# Patient Record
Sex: Male | Born: 1965 | Race: White | Hispanic: No | Smoking: Current every day smoker
Health system: Southern US, Community
[De-identification: ages and names within clinical notes are randomized; demographics above are authoritative.]

## PROBLEM LIST (undated history)

## (undated) DIAGNOSIS — K219 Gastro-esophageal reflux disease without esophagitis: Secondary | ICD-10-CM

## (undated) DIAGNOSIS — K589 Irritable bowel syndrome without diarrhea: Secondary | ICD-10-CM

## (undated) DIAGNOSIS — Z681 Body mass index (BMI) 19 or less, adult: Secondary | ICD-10-CM

## (undated) HISTORY — DX: Gastro-esophageal reflux disease without esophagitis: K21.9

## (undated) HISTORY — PX: ESOPHAGOGASTRODUODENOSCOPY: SHX1529

## (undated) HISTORY — DX: Irritable bowel syndrome without diarrhea: K58.9

## (undated) HISTORY — DX: Body mass index (BMI) 19.9 or less, adult: Z68.1

---

## 2003-02-28 ENCOUNTER — Encounter: Payer: Self-pay | Admitting: *Deleted

## 2003-02-28 ENCOUNTER — Emergency Department (HOSPITAL_COMMUNITY): Admission: EM | Admit: 2003-02-28 | Discharge: 2003-02-28 | Payer: Self-pay | Admitting: *Deleted

## 2003-03-10 ENCOUNTER — Encounter (HOSPITAL_COMMUNITY): Admission: RE | Admit: 2003-03-10 | Discharge: 2003-04-09 | Payer: Self-pay | Admitting: Family Medicine

## 2003-03-13 ENCOUNTER — Ambulatory Visit (HOSPITAL_COMMUNITY): Admission: RE | Admit: 2003-03-13 | Discharge: 2003-03-13 | Payer: Self-pay | Admitting: Family Medicine

## 2003-03-13 ENCOUNTER — Encounter: Payer: Self-pay | Admitting: Family Medicine

## 2004-07-06 ENCOUNTER — Ambulatory Visit (HOSPITAL_COMMUNITY): Admission: RE | Admit: 2004-07-06 | Discharge: 2004-07-06 | Payer: Self-pay | Admitting: Family Medicine

## 2005-02-24 ENCOUNTER — Ambulatory Visit (HOSPITAL_COMMUNITY): Admission: RE | Admit: 2005-02-24 | Discharge: 2005-02-24 | Payer: Self-pay | Admitting: Family Medicine

## 2005-03-27 ENCOUNTER — Ambulatory Visit: Payer: Self-pay | Admitting: Infectious Diseases

## 2006-06-04 ENCOUNTER — Ambulatory Visit: Payer: Self-pay | Admitting: Gastroenterology

## 2006-06-06 ENCOUNTER — Encounter (INDEPENDENT_AMBULATORY_CARE_PROVIDER_SITE_OTHER): Payer: Self-pay | Admitting: Specialist

## 2006-06-06 ENCOUNTER — Ambulatory Visit: Payer: Self-pay | Admitting: Gastroenterology

## 2006-06-06 ENCOUNTER — Ambulatory Visit (HOSPITAL_COMMUNITY): Admission: RE | Admit: 2006-06-06 | Discharge: 2006-06-06 | Payer: Self-pay | Admitting: Gastroenterology

## 2006-07-13 ENCOUNTER — Ambulatory Visit: Payer: Self-pay | Admitting: Gastroenterology

## 2007-02-13 ENCOUNTER — Ambulatory Visit: Payer: Self-pay | Admitting: Gastroenterology

## 2007-04-12 ENCOUNTER — Ambulatory Visit: Payer: Self-pay | Admitting: Gastroenterology

## 2008-05-15 ENCOUNTER — Ambulatory Visit: Payer: Self-pay | Admitting: Gastroenterology

## 2008-11-30 ENCOUNTER — Encounter: Payer: Self-pay | Admitting: Gastroenterology

## 2009-05-05 ENCOUNTER — Ambulatory Visit (HOSPITAL_COMMUNITY): Admission: RE | Admit: 2009-05-05 | Discharge: 2009-05-05 | Payer: Self-pay | Admitting: Family Medicine

## 2009-06-24 ENCOUNTER — Encounter: Payer: Self-pay | Admitting: Urgent Care

## 2009-07-05 DIAGNOSIS — K589 Irritable bowel syndrome without diarrhea: Secondary | ICD-10-CM | POA: Insufficient documentation

## 2009-07-05 DIAGNOSIS — F17200 Nicotine dependence, unspecified, uncomplicated: Secondary | ICD-10-CM | POA: Insufficient documentation

## 2009-07-05 DIAGNOSIS — K219 Gastro-esophageal reflux disease without esophagitis: Secondary | ICD-10-CM | POA: Insufficient documentation

## 2009-07-07 ENCOUNTER — Ambulatory Visit: Payer: Self-pay | Admitting: Gastroenterology

## 2010-06-30 ENCOUNTER — Encounter (INDEPENDENT_AMBULATORY_CARE_PROVIDER_SITE_OTHER): Payer: Self-pay | Admitting: *Deleted

## 2010-07-18 ENCOUNTER — Encounter: Payer: Self-pay | Admitting: Urgent Care

## 2010-08-16 ENCOUNTER — Ambulatory Visit: Payer: Self-pay | Admitting: Gastroenterology

## 2010-10-11 NOTE — Medication Information (Signed)
Summary: DEXILANT 60MG   DEXILANT 60MG    Imported By: Rexene Alberts 07/18/2010 10:36:36  _____________________________________________________________________  External Attachment:    Type:   Image     Comment:   External Document  Appended Document: DEXILANT 60MG  Pt due for 1 yr OV.  Please arrange.   Prescriptions: DEXILANT 60 MG CPDR (DEXLANSOPRAZOLE) one by mouth daily for acid reflux  #30 x 0   Entered and Authorized by:   Joselyn Arrow FNP-BC   Signed by:   Joselyn Arrow FNP-BC on 07/18/2010   Method used:   Electronically to        [facility] [phone]* (retail)       [address]       [place], [place]  [address]       Ph: [phone] or [phone]       Fax: [phone]   RxID:   [id number]     Appended Document: DEXILANT 60MG  appt for 12/6 @ 2pm w/SF

## 2010-10-11 NOTE — Letter (Signed)
Summary: Recall Office Visit  [facility]  [address]   [place], [place] [address]   Phone: [phone]  Fax: [phone]      June 30, 2010   [name] [address] [facility] [address] [place], [place]  [address] 1966/04/23   Dear [name],   According to our records, it is time for you to schedule a follow-up office visit with [place].   At your convenience, please call [phone] to schedule an office visit. If you have any questions, concerns, or feel that this letter is in error, we would appreciate your call.   Sincerely,    Rosine Beat  [facility] Ph: [phone]   Fax: [phone]

## 2010-10-13 NOTE — Assessment & Plan Note (Signed)
Summary: IBS, GERD   Visit Type:  Follow-up Visit Primary Care Ameerah Huffstetler:  Phillips Odor, M.D.  Chief Complaint:  IBS and GERD.  History of Present Illness: Meds working. Nees refills. Bms: at least 1-2 times a day, nl except for 2 days out of a month. Pain: none. Heartburn today because out of meds but not bad. If misses meds Sx worsen. Weight:  ~125 lbs.  Allergies: 1)  ! Codeine  Past History:  Past Medical History: GERD Irritable Bowel Syndrome **EGD/BX 2007-NL SB, MILD GASTRITIS  Family History: No FH of Colon Cancer OR POLYPS  Social History: MARRIED SMOKES NO ETOH  Review of Systems       2007: 120 LBS  2008: 119 LBS  Vital Signs:  Patient profile:   45 year old male Height:      68 inches Weight:      118 pounds BMI:     18.01 Temp:     98.4 degrees F oral Pulse rate:   84 / minute BP sitting:   100 / 70  (left arm) Cuff size:   regular  Vitals Entered By: Cloria Spring LPN (August 16, 2010 2:18 PM)  Physical Exam  General:  Well developed, well nourished, no acute distress. Head:  Normocephalic and atraumatic. Lungs:  Clear throughout to auscultation. Heart:  Regular rate and rhythm; no murmurs. Abdomen:  Soft, nontender and nondistended. Normal bowel sounds.  Impression & Recommendations:  Problem # 1:  IRRITABLE BOWEL SYNDROME (ICD-564.1) Assessment Improved Dicyclomine as needed. OPV in 12 mos.  Problem # 2:  GERD (ICD-530.81) Assessment: Improved Continue Dexilant, RFx12.  CC: PCP Prescriptions: DEXILANT 60 MG CPDR (DEXLANSOPRAZOLE) one by mouth daily for acid reflux  #30 x 11   Entered and Authorized by:   West Bali MD   Signed by:   West Bali MD on 08/16/2010   Method used:   [facility]        [facility]  [address] [id number]       [address]       [place], [place]  [address]       Ph: [phone] or [phone]       Fax: [phone]   RxID:   [id number] DICYCLOMINE HCL 10 MG CAPS  (DICYCLOMINE HCL) one by mouth qac and at bedtime prn  #60 x 11   Entered and Authorized by:   West Bali MD   Signed by:   West Bali MD on 08/16/2010   Method used:   [facility]        [facility]  [address] [id number]       [address]       [place], [place]  [address]       Ph: [phone] or [phone]       Fax: [phone]   RxID:   [id number]   Appended Document: IBS, GERD 3YR F/U OPV IS IN THE COMPUTER  Appended Document: Orders Update    Clinical Lists Changes  Orders: Added new Service order of Est. Patient Level II (57322) - Signed

## 2011-01-24 NOTE — Assessment & Plan Note (Signed)
NAMEMarland Kitchen  [name], [name]              CHART#:  [id number]   DATE:  04/12/2007                       DOB:  11/09/1965   REFERRING PHYSICIAN:  Corrie Mckusick, M.D.   PROBLEM LIST:  1. Irritable bowel syndrome.  2. Gastroesophageal reflux disease.   SUBJECTIVE:  The patient is a 45 year old male who presents as a return  patient visit.  His symptoms are improved since his visit in June 2008.  He is managing his symptoms with Prevacid, Fibercon, and dicyclomine.  The burning in his abdomen is not nearly as bad as it was.  He is taking  Prevacid once daily.  He still continues to smoke.  His bowel movements  are good.  He has normal bowel movements and occasionally his stomach  acts up.   MEDICATIONS:  1. Dicyclomine twice daily.  2. Imodium as needed.  3. Fibercon 2 daily.  4. Prevacid 30 mg daily.  5. Tylenol 2 daily as needed.  6. Xanax 5 mg 1/2 tablet as needed.   OBJECTIVE:  Weight 120 pounds (unchanged since November 2007), height 5  feet 8 inches, temperature 97.9, blood pressure 110/60, pulse 78.  GENERAL:  He is in no apparent distress.  He is alert and oriented x4.  LUNGS:  Clear to auscultation bilaterally.  CARDIOVASCULAR:  Regular rhythm.  ABDOMEN:  Bowel sounds present.  Soft, nontender.  Nondistended.   ASSESSMENT:  The patient is a 45 year old male with irritable bowel and  gastroesophageal reflux disease, which is currently well-controlled.  Thank you for allowing me to see this patient in consultation.  My  recommendations are to follow.   RECOMMENDATIONS:  1. He should continue the Prevacid, Fibercon, and dicyclomine.  He is      given a prescription for Prevacid for a 90-day supply with 3      refills.  He is also given a Prevacid co-pay      reimbursement card.  2. Outpatient visit in 4 months.       Kassie Mends, M.D.  Electronically Signed     SM/MEDQ  D:  04/12/2007  T:  04/13/2007  Job:  [id number]   cc:   Corrie Mckusick, M.D.

## 2011-01-24 NOTE — Assessment & Plan Note (Signed)
NAMEMarland Kitchen  [name], [name]              CHART#:  [id number]   DATE:  02/13/2007                       DOB:  [birth year]   REFERRING PHYSICIAN:  Dr. Assunta Found.   PROBLEM LIST:  1. Irritable bowel syndrome.  2. Functional gut disorder (irritable bowel syndrome perhaps      gastroesophageal reflux disease).   SUBJECTIVE:  [name] is a 45 year old male who was last seen in  clinic in November 2007. He was asked to use dicyclomine, as well as  fiber supplements, and Tylenol. His Prilosec was discontinued at the  time. He states that he was doing great until approximately six weeks  ago when he began to have belching and food laying on his stomach. He  has this burning sensation in his mid abdomen and he began to use his  dicyclomine 4 times daily. He stated that using dicyclomine 4 times a  day made his burning sensation worse, so now he is down to twice-a-day.  He is using his dicyclomine after he eats, and not before. He has this  burning sensation 1-2 times a week. [name] makes his burning sensation  worse. He has up to 2 bowel movements a day which are mostly normal.  When he is having a flare, his stool is like phlegm. He denies any  weight loss or problems swallowing. He is having problems with sinus  drainage and is using an over-the-counter sinus pill. He denies any  vomiting. He has problems with indigestion and it feels like he is  spitting up acid once a week. He does smoke one pack a day. He denies  any alcohol use for 10 years. He is not using any BC, Goody's, or  aspirin. He is only using Tylenol.   MEDICATIONS:  1. Dicyclomine 2-3 times daily.  2. Imodium as needed.  3. Fibercon 2 daily.  4. Tylenol as needed.   OBJECTIVE:  VITAL SIGNS:  Weight 119 1/2 pounds (unchanged since  November 2007), height 5 foot 8 inches, temperature 98.1, blood pressure  190/60, pulse 76.  GENERAL:  No apparent distress, awake, alert, and oriented x4.HEENT:  Atraumatic normocephalic,  pupils equal, round, and reactive to light,  mouth shows no oral lesions, posterior pharynx without erythema or  exudate. NECK:  Full range of motion, no lymphadenopathy.LUNGS:  Clear  to auscultation bilaterally.CARDIAC:  Regular rhythm, no murmur, normal  S1 and S2.  ABDOMEN:  Bowel sounds are present. Soft and nontender, nondistended, no  rebound or guarding.   ASSESSMENT:  [name] is a 45 year old male with irritable bowel  syndrome. His current symptoms are either an exacerbation of his  irritable bowel syndrome, non-ulcer dyspepsia, or gastric esophageal  reflux disease, which is uncontrolled. Thank you for allowing me to see  [name] in consultation. My recommendations follow.   RECOMMENDATIONS:  1. He is asked to use his dicyclomine 30 minutes before meals, not      after.  2. He is given Prevacid 30 mg tablets, number 12 samples, and asked to      use them for the next 12 days. If the burning sensation and the      acid taste in the back of his throat, then he is given a      prescription and asked to use 1 daily. He is also given the  [facility] GERD handout and is asked to follow those recommendations. I      did discuss with him that smoking may exacerbate his symptoms.  3. I did recommend to him that he should decrease the amount of      cigarettes he smokes on a daily basis.  4. He should continue his high fiber diet.  5. He has a return patient visit to see me in six weeks.       Kassie Mends, M.D.  Electronically Signed     SM/MEDQ  D:  02/13/2007  T:  02/14/2007  Job:  [id number]   cc:   Corrie Mckusick, M.D.

## 2011-01-24 NOTE — Assessment & Plan Note (Signed)
NAMEMarland Kitchen  [name], [name]              CHART#:  [id number]   DATE:  05/15/2008                       DOB:  10/07/1965   PRIMARY CARE PHYSICIAN:  Dr. Phillips [name].   CHIEF COMPLAINT:  Followup IBS and GERD.   PROBLEM LIST:  1. Irritable bowel syndrome.  2. Gastroesophageal reflux disease.   SUBJECTIVE:  The patient is a 45 year old Caucasian male who has been  doing very well.  He usually has about 1 week out of each month where he  presents with diarrhea stool.  He has been taking Imodium like once or  twice a week and doing very well.  He has been on Prevacid 30 mg daily.  He has tried to discontinue this on his own, but does have heartburn and  ingestion when he stops the medication.  He is asking for a medicine  that may be cheaper than his 50 dollar per month co-pay.  He has  previously tried over-the-counter Prilosec 20 mg daily, but denies any  other PPI use.  He denies any anorexia.  His weight has remained stable.  He does use dicyclomine 10 mg daily as needed for diarrhea.  He is not  using fiber recently as he feels it does not have much effect on his  IBS.   CURRENT MEDICATIONS:  Dicyclomine 10 mg daily, Imodium 2 mg p.r.n.,  Tylenol Extra Strength 1 to 2 daily p.r.n., Prevacid 30 mg daily, and  Xanax 2.5 mg p.r.n.   ALLERGIES:  Codeine.   OBJECTIVE:  VITAL SIGNS:  Weight 155 pounds, height 68 inches,  temperature 98.1, blood pressure 102/78, and pulse 82.  GENERAL:  The patient is a well-developed and well-nourished Caucasian  male, in no acute distress.  HEENT:  Sclerae clear and nonicteric.  Conjunctivae pink.  Oropharynx  pink and moist without any lesions.  NECK:  Supple without mass or thyromegaly.  CHEST:  Heart regular rate and rhythm.  Normal S1 and S2.  ABDOMEN:  Positive bowel sounds x4.  No bruits auscultated.  Soft,  nontender, and nondistended without palpable mass or hepatosplenomegaly.  No rebound, tenderness, or guarding.  EXTREMITIES:  Without clubbing  or edema.   ASSESSMENT:  The patient is a 45 year old Caucasian male with history of  chronic irritable bowel syndrome well controlled on antispasmodic and  p.r.n. Imodium.   He also has chronic gastroesophageal reflux disease, well controlled on  Prevacid, but is concerned about the $50 co-pay.   PLAN:  1. We would discontinue Prevacid.  2. We had given him a box of Kapidex 60 mg daily samples.  He has also      been given a rebate  card for a trial.  3. Continue dicyclomine 10 mg daily p.r.n.  4. Office visit in 1 year or sooner if needed.       [name], N.P.  Electronically Signed     [name], M.D.  Electronically Signed    KJ/MEDQ  D:  05/15/2008  T:  05/15/2008  Job:  [id number]   cc:   [name], M.D.

## 2011-01-27 NOTE — Consult Note (Signed)
[name], [name]             ACCOUNT NO.:  [id number]   MEDICAL RECORD NO.:  [id number]          PATIENT TYPE:  AMB   LOCATION:  DAY                           FACILITY:  [facility]   PHYSICIAN:  Kassie Mends, M.D.      DATE OF BIRTH:  [birth year]   DATE OF CONSULTATION:  06/04/2006  DATE OF DISCHARGE:                                   CONSULTATION   REFERRING PHYSICIAN:  Dr. Assunta Found   REASON FOR CONSULTATION:  Abdominal pain, diarrhea, now with constipation.   HISTORY OF PRESENT ILLNESS:  [name] is a 45 year old male who had  abdominal complaints for 8 years.  He complains of his stomach stays tore  up.  His last incident has been occurring for the last 4-5 weeks until  Friday.  His flares usually last 7-10 days.  He reports getting diarrhea and  then he gets back to normal.  During his episodes of watery stool he usually  takes Imodium.  He now complains of being locked up.  He cannot go at all.  He complains of keeping indigestion constantly.  He described his  indigestion as belching and reflux of contents into his esophagus and  returning on itself.  He reports having ultrasounds, colonoscopies, and  upper endoscopy and everything has been normal.  He reports having stool  samples and everything has been normal.  He states if he takes half of a 0.5-  mg Xanax everything settles down.  His diarrhea is helped with Imodium.  If he stands up in the morning he just belches.  He may have gas all day.  He wants relief from the acid in his stomach, the diarrhea, and the burning  sensation in the stomach.  His weight fluctuates between 121-125 pounds to  114 pounds.  He weighs 117 pounds today.  He has been known to have a small  hemorrhoid.  On Sunday he had straining and saw some bright red blood.  He  saw some blood on the tissue when he wiped.  He denies any blood in the  stool.  His last bowel movement was approximately 2 hours ago and it was  loose to watery.  He was having  to strain to have a bowel movement.  His  appetite has been okay.  He had been on dicyclomine for times a day for the  last 3 weeks.  It has decreased his belching.  He continues to have to go to  the bathroom 30 minutes after he eats.  Having a bowel movement keeps him  from having cramping.  Normally he has one bowel movement a day.   PAST MEDICAL HISTORY:  Sinus.   PAST SURGICAL HISTORY:  Removal of cyst on his abdomen 10 years ago.   ALLERGIES:  CODEINE.   MEDICATIONS:  1. Dicyclomine q.i.d.  2. Imodium p.r.n.  3. Aleve p.r.n.   FAMILY HISTORY:  He denies any family history of colon cancer, colon polyps,  inflammatory bowel disease, or celiac sprue.   SOCIAL HISTORY:  He is married and has three children.  He has one natural  child.  He is employed with [employer].  He smokes one pack a day.  He stopped drinking 10 years ago.   REVIEW OF SYSTEMS:  Reveals a usual body mass index of 19, which makes him  underweight.  His review of systems per the HPI, otherwise all systems are  negative.   PHYSICAL EXAMINATION:  VITAL SIGNS:  Weight 117.5 pounds, height 5 feet 8  inches, BMI 17.8 (underweight), temperature 98.3, blood pressure 106/68,  pulse 76.  GENERAL:  He is in no apparent distress, alert and oriented x4.  HEENT:  Exam is atraumatic, normocephalic.  Pupils equal and reactive to  light.  Mouth:  No oral lesions.  Posterior pharynx without erythema or  exudate.  NECK:  Has full range of motion.  No lymphadenopathy.  LUNGS:  Clear to auscultation bilaterally.  CARDIOVASCULAR:  A regular rhythm, no murmur, normal S1 and S2.  ABDOMEN:  Bowel sounds are present, soft, nontender, nondistended.  No  rebound or guarding.  No hepatosplenomegaly.  No abdominal bruits.  EXTREMITIES:  Without cyanosis, clubbing or edema.  NEUROLOGIC:  He has no focal neurologic deficits.  RECTAL:  Heme-positive, trace; small internal hemorrhoids, grade 1.  No  mass, normal perineal area,  no anal fissure.   Labs from May 10, 2006:  White count 7.4, hemoglobin 16.8, platelets 308.  Sed rate 2.  Potassium 4.1, creatinine 1, total bili 1.7, alk phos 55, AST  15, ALT 12, albumin 5.0, calcium 9.6.  TSH 1.018.  Helicobacter pylori 0.5.  C. difficile negative.  Fecal wbc's:  None.   ASSESSMENT:  [name] is a 45 year old male with bright red blood per  rectum, likely secondary to hemorrhoids.  His intermittent diarrhea is  likely secondary to irritable bowel syndrome, diarrhea predominant.  The  differential diagnosis includes celiac sprue and giardiasis.  His symptoms  have now resolved on dicyclomine.   Thank you for allowing me to see [name] in consultation.  My  recommendations follow.   RECOMMENDATIONS:  Will obtain an EGD to evaluate his persistent abdominal  pain, to evaluate for a gastritis, and obtain biopsies for eosinophilic  gastritis or H. pylori gastritis and duodenal biopsies for celiac sprue.  If  his diarrhea should return, I would consider obtaining stool for Giardia  antigen.  He had formed stool in his rectal vault and obtaining Giardia  antigen at this time is of low clinical value.  I would change his  dicyclomine to twice a day for 1 week and then once a day for 1 week and  then as needed.  He is asked to initiate a high-fiber diet and a handout is  given and explained.  He may return to see me in 1 month.   Please feel free to contact me at [phone] with additional questions.      Kassie Mends, M.D.  Electronically Signed     SM/MEDQ  D:  06/05/2006  T:  06/06/2006  Job:  [id number]   cc:   Corrie Mckusick, M.D.  Fax: [phone]

## 2011-01-27 NOTE — Op Note (Signed)
NAMEXUE, LOW             ACCOUNT NO.:  [id number]   MEDICAL RECORD NO.:  [id number]          PATIENT TYPE:  AMB   LOCATION:  DAY                           FACILITY:  [facility]   PHYSICIAN:  Kassie Mends, M.D.      DATE OF BIRTH:  [birth year]   DATE OF PROCEDURE:  06/06/2006  DATE OF DISCHARGE:  06/06/2006                                  PROCEDURE NOTE   REFERRING PHYSICIAN:  Assunta Found, MD   PROCEDURE:  Esophagogastroduodenoscopy with cold forceps biopsies.   ENDOSCOPIST:  Kassie Mends, MD   INDICATION FOR EXAM:  Mr. Kampe is a 45 year old male with chronic burning  epigastric pain and episodic diarrhea.   FINDINGS:  1. Irregular Z line.  2. Small hiatal hernia.  3. Patent Schatzki's ring.  4  Moderate antritis without erosion.  Biopsies obtained with cold forceps  to evaluate for H. pylori infection.  1. Mild erythema and edema of the duodenal bulb.  Biopsies obtained via      cold forceps.  2. Normal-appearing duodenal mucosa in the second portion of the duodenum.      Biopsies obtained to evaluate for celiac sprue.   RECOMMENDATIONS:  1..  Follow up biopsies.  1. Over-the-counter Prilosec daily.  2. Diet handout given to instruct on avoiding gastric irritants.  3. Follow up with Dr. Kassie Mends in 3-4 weeks.   MEDICATIONS:  1. Demerol 75 mg IV.  2. Versed 6 mg IV.  3. Phenergan 12.5 mg IV.   PROCEDURE TECHNIQUE:  Physical exam was performed and informed consent was  obtained from the patient after explaining the benefits, risks and  alternatives to the procedure, which the patient appeared to understand and  so stated.  The patient was connected to the monitor and placed in the left  lateral position.  Continuous oxygen was provided by nasal and IV medicine  administered through an indwelling cannula.  After administration of  sedation, the patient's esophagus was intubated and the scope was advanced  under direct visualization to the second portion of  the  duodenum.  The scope was slowly removed by carefully examining the  anatomy, integrity and texture of the mucosa on the way out.  The patient  was recovered in the endoscopy suite and discharged to home in satisfactory  condition.      Kassie Mends, M.D.  Electronically Signed     SM/MEDQ  D:  06/06/2006  T:  06/08/2006  Job:  [id number]   cc:   Corrie Mckusick, M.D.  Fax: [phone]

## 2011-06-20 ENCOUNTER — Encounter: Payer: Self-pay | Admitting: Gastroenterology

## 2011-10-24 ENCOUNTER — Encounter: Payer: Self-pay | Admitting: Gastroenterology

## 2011-10-25 ENCOUNTER — Encounter: Payer: Self-pay | Admitting: Gastroenterology

## 2011-10-25 ENCOUNTER — Ambulatory Visit (INDEPENDENT_AMBULATORY_CARE_PROVIDER_SITE_OTHER): Payer: BC Managed Care – PPO | Admitting: Gastroenterology

## 2011-10-25 DIAGNOSIS — K219 Gastro-esophageal reflux disease without esophagitis: Secondary | ICD-10-CM

## 2011-10-25 DIAGNOSIS — K589 Irritable bowel syndrome without diarrhea: Secondary | ICD-10-CM

## 2011-10-25 MED ORDER — DICYCLOMINE HCL 10 MG PO CAPS: ORAL_CAPSULE | ORAL | Status: DC

## 2011-10-25 MED ORDER — DEXLANSOPRAZOLE 60 MG PO CPDR: 60.0000 mg | DELAYED_RELEASE_CAPSULE | Freq: Every day | ORAL | Status: DC

## 2011-10-25 NOTE — Patient Instructions (Signed)
Continue DEXILANT. USE BENTYL 1 TO 2 TIMES A DAY. FOLLOW UP EVERY 1 TO 2 YEARS.   Lifestyle and home remedies You may eliminate or reduce the frequency of heartburn by making the following lifestyle changes:    Control your weight. Being overweight is a major risk factor for heartburn and GERD. Excess pounds put pressure on your abdomen, pushing up your stomach and causing acid to back up into your esophagus.     Eat smaller meals. 4 TO 6 MEALS A DAY. This reduces pressure on the lower esophageal sphincter, helping to prevent the valve from opening and acid from washing back into your esophagus.     Loosen your belt. Clothes that fit tightly around your waist put pressure on your abdomen and the lower esophageal sphincter.    Eliminate heartburn triggers. Everyone has specific triggers.     Common triggers such as fatty or fried foods, spicy food, tomato sauce, carbonated beverages, alcohol, chocolate, mint, garlic, onion, caffeine and nicotine may make heartburn worse.     Avoid stooping or bending. Tying your shoes is OK. Bending over for longer periods to weed your garden isn't, especially soon after eating.     Don't lie down after a meal. Wait at least three to four hours after eating before going to bed, and don't lie down right after eating.   Alternative medicine   Several home remedies exist for treating GERD, but they provide only temporary relief. They include drinking baking soda (sodium bicarbonate) added to water or drinking other fluids such as baking soda mixed with cream of tartar and water.   Although these liquids create temporary relief by neutralizing, washing away or buffering acids, eventually they aggravate the situation by adding gas and fluid to your stomach, increasing pressure and causing more acid reflux. Further, adding more sodium to your diet may increase your blood pressure and add stress to your heart, and excessive bicarbonate ingestion can alter the  acid-base balance in your body.

## 2011-10-25 NOTE — Assessment & Plan Note (Signed)
SX CONTROLLED.  DICYCLOMINE 1-2 TIMES DAILY.

## 2011-10-25 NOTE — Assessment & Plan Note (Signed)
FAILED PREVACID & PRILOSEC IN THE PAST. SX CONTROLLED ON DEXILANT SINCE SEP 2009.  CONTINUE DEXILANT. REFILLED x1 YEAR. OPV Q1-2 YEARS. REFILLS OVER THE PHONE.

## 2011-10-25 NOTE — Progress Notes (Signed)
Faxed to PCP

## 2011-10-25 NOTE — Progress Notes (Signed)
  Subjective:    Patient ID: [name], male    DOB: 1966/01/11, 46 y.o.   MRN: [id number]  PCP: Phillips Odor  HPI PT DOING GOOD. Has tried PPI in past and Sx not controlled. Dexilant controls Sx 90% of the time. Depends on what he eats. Has flares 1 to 2 times a month. Bms: nl.using dicyclomine: 1 x/day. No problems swallowing, nausea, or vomiting.   Past Medical History  Diagnosis Date  . GERD (gastroesophageal reflux disease)   . IBS (irritable bowel syndrome)       . Body mass index (BMI) of 19 or less in adult 2007 117 LBS   Past Surgical History  Procedure Date  . Esophagogastroduodenoscopy SEP 2007 EPIG PAIN, DIARRHEA    MILD GASTRITIS/DUODENITIS, NO CELIAC SPRUE, HH,  SCHATZKI'S RING   Allergies  Allergen Reactions  . Codeine     REACTION: unknown reaction   Current Outpatient Prescriptions  Medication Sig Dispense Refill  . DEXILANT 60 MG capsule Take 60 mg by mouth daily.       Family History  Problem Relation Age of Onset  . Colon cancer Neg Hx   . Colon polyps Neg Hx     Review of Systems     Objective:   Physical Exam  Constitutional: He is oriented to person, place, and time. No distress.  HENT:  Head: Normocephalic and atraumatic.  Cardiovascular: Normal rate and regular rhythm.   Pulmonary/Chest: Effort normal and breath sounds normal. No respiratory distress.  Abdominal: Soft. Bowel sounds are normal. He exhibits no distension. There is no tenderness.  Neurological: He is alert and oriented to person, place, and time.       NO FOCAL DEFICITS           Assessment & Plan:

## 2011-10-30 NOTE — Progress Notes (Signed)
Reminder in epic to follow up every one to two years

## 2011-11-09 ENCOUNTER — Telehealth: Payer: Self-pay

## 2011-11-09 MED ORDER — PANTOPRAZOLE SODIUM 40 MG PO TBEC: 40.0000 mg | DELAYED_RELEASE_TABLET | Freq: Every day | ORAL | Status: DC

## 2011-11-09 NOTE — Telephone Encounter (Signed)
Working on [employer] for pts dexilant. Pt has tried prilosec, prevacid, nexium, protonix, rolaids and tums in the past. Insurance requirements states pt needs to try them within the last 180 days. Ginger spoke with pt, he has tried protonix in the past but not recently. He is willing to try it again and will call if it doesn't work so we can try PA again.  [facility]

## 2011-11-10 ENCOUNTER — Other Ambulatory Visit: Payer: Self-pay | Admitting: Urgent Care

## 2011-11-10 MED ORDER — PANTOPRAZOLE SODIUM 40 MG PO TBEC: 40.0000 mg | DELAYED_RELEASE_TABLET | Freq: Every day | ORAL | Status: DC

## 2012-02-04 ENCOUNTER — Other Ambulatory Visit: Payer: Self-pay | Admitting: Gastroenterology

## 2014-06-15 ENCOUNTER — Encounter (INDEPENDENT_AMBULATORY_CARE_PROVIDER_SITE_OTHER): Payer: Self-pay

## 2014-06-15 ENCOUNTER — Ambulatory Visit (HOSPITAL_COMMUNITY)
Admission: RE | Admit: 2014-06-15 | Discharge: 2014-06-15 | Disposition: A | Discharge status: Home / Self Care | Payer: BC Managed Care – PPO | Source: Ambulatory Visit | Attending: Family Medicine | Admitting: Family Medicine

## 2014-06-15 ENCOUNTER — Other Ambulatory Visit (HOSPITAL_COMMUNITY): Payer: Self-pay | Admitting: Family Medicine

## 2014-06-15 DIAGNOSIS — R05 Cough: Secondary | ICD-10-CM | POA: Insufficient documentation

## 2014-06-15 DIAGNOSIS — R0789 Other chest pain: Secondary | ICD-10-CM

## 2014-06-15 DIAGNOSIS — Z0000 Encounter for general adult medical examination without abnormal findings: Secondary | ICD-10-CM

## 2014-06-15 DIAGNOSIS — Z720 Tobacco use: Secondary | ICD-10-CM | POA: Insufficient documentation

## 2014-06-15 DIAGNOSIS — R509 Fever, unspecified: Secondary | ICD-10-CM | POA: Diagnosis present

## 2014-06-15 DIAGNOSIS — R0989 Other specified symptoms and signs involving the circulatory and respiratory systems: Secondary | ICD-10-CM | POA: Diagnosis not present

## 2014-06-16 ENCOUNTER — Other Ambulatory Visit (HOSPITAL_COMMUNITY): Payer: Self-pay | Admitting: Family Medicine

## 2014-06-16 DIAGNOSIS — R5383 Other fatigue: Secondary | ICD-10-CM

## 2014-06-16 DIAGNOSIS — R634 Abnormal weight loss: Secondary | ICD-10-CM

## 2014-06-16 DIAGNOSIS — R0789 Other chest pain: Secondary | ICD-10-CM

## 2014-06-18 ENCOUNTER — Ambulatory Visit (HOSPITAL_COMMUNITY): Payer: BC Managed Care – PPO

## 2014-06-18 ENCOUNTER — Ambulatory Visit (HOSPITAL_COMMUNITY)
Admission: RE | Admit: 2014-06-18 | Discharge: 2014-06-18 | Disposition: A | Discharge status: Home / Self Care | Payer: BC Managed Care – PPO | Source: Ambulatory Visit | Attending: Family Medicine | Admitting: Family Medicine

## 2014-06-18 ENCOUNTER — Encounter (HOSPITAL_COMMUNITY): Payer: Self-pay

## 2014-06-18 DIAGNOSIS — J439 Emphysema, unspecified: Secondary | ICD-10-CM | POA: Diagnosis not present

## 2014-06-18 DIAGNOSIS — R911 Solitary pulmonary nodule: Secondary | ICD-10-CM | POA: Insufficient documentation

## 2014-06-18 DIAGNOSIS — R079 Chest pain, unspecified: Secondary | ICD-10-CM | POA: Insufficient documentation

## 2014-06-18 DIAGNOSIS — R5383 Other fatigue: Secondary | ICD-10-CM | POA: Diagnosis not present

## 2014-06-18 DIAGNOSIS — R634 Abnormal weight loss: Secondary | ICD-10-CM | POA: Insufficient documentation

## 2014-06-18 DIAGNOSIS — R0789 Other chest pain: Secondary | ICD-10-CM

## 2014-06-18 MED ORDER — IOHEXOL 300 MG/ML  SOLN
100.0000 mL | Freq: Once | INTRAMUSCULAR | Status: AC | PRN
  Administered 2014-06-18: 100 mL via INTRAVENOUS

## 2014-06-19 ENCOUNTER — Other Ambulatory Visit (HOSPITAL_COMMUNITY): Payer: Self-pay | Admitting: Family Medicine

## 2014-06-19 DIAGNOSIS — N5089 Other specified disorders of the male genital organs: Secondary | ICD-10-CM

## 2014-06-26 ENCOUNTER — Other Ambulatory Visit (HOSPITAL_COMMUNITY): Payer: BC Managed Care – PPO

## 2015-03-09 ENCOUNTER — Other Ambulatory Visit (HOSPITAL_COMMUNITY): Payer: Self-pay

## 2015-03-09 ENCOUNTER — Other Ambulatory Visit (HOSPITAL_COMMUNITY): Payer: Self-pay | Admitting: Family Medicine

## 2015-03-09 DIAGNOSIS — M5412 Radiculopathy, cervical region: Secondary | ICD-10-CM

## 2015-03-09 DIAGNOSIS — M25512 Pain in left shoulder: Secondary | ICD-10-CM

## 2015-03-09 DIAGNOSIS — M542 Cervicalgia: Secondary | ICD-10-CM

## 2016-12-13 DIAGNOSIS — Z681 Body mass index (BMI) 19 or less, adult: Secondary | ICD-10-CM | POA: Diagnosis not present

## 2016-12-13 DIAGNOSIS — Z23 Encounter for immunization: Secondary | ICD-10-CM | POA: Diagnosis not present

## 2016-12-13 DIAGNOSIS — S80861A Insect bite (nonvenomous), right lower leg, initial encounter: Secondary | ICD-10-CM | POA: Diagnosis not present

## 2016-12-13 DIAGNOSIS — Z1389 Encounter for screening for other disorder: Secondary | ICD-10-CM | POA: Diagnosis not present

## 2017-04-27 DIAGNOSIS — R109 Unspecified abdominal pain: Secondary | ICD-10-CM | POA: Diagnosis not present

## 2017-04-27 DIAGNOSIS — R112 Nausea with vomiting, unspecified: Secondary | ICD-10-CM | POA: Diagnosis not present

## 2017-04-27 DIAGNOSIS — N200 Calculus of kidney: Secondary | ICD-10-CM | POA: Diagnosis not present

## 2017-04-27 DIAGNOSIS — R1032 Left lower quadrant pain: Secondary | ICD-10-CM | POA: Diagnosis not present

## 2017-07-23 DIAGNOSIS — S2231XA Fracture of one rib, right side, initial encounter for closed fracture: Secondary | ICD-10-CM | POA: Diagnosis not present

## 2017-09-18 DIAGNOSIS — R5383 Other fatigue: Secondary | ICD-10-CM | POA: Diagnosis not present

## 2017-09-18 DIAGNOSIS — E559 Vitamin D deficiency, unspecified: Secondary | ICD-10-CM | POA: Diagnosis not present

## 2017-09-18 DIAGNOSIS — K589 Irritable bowel syndrome without diarrhea: Secondary | ICD-10-CM | POA: Diagnosis not present

## 2017-09-18 DIAGNOSIS — Z681 Body mass index (BMI) 19 or less, adult: Secondary | ICD-10-CM | POA: Diagnosis not present

## 2017-09-18 DIAGNOSIS — F419 Anxiety disorder, unspecified: Secondary | ICD-10-CM | POA: Diagnosis not present

## 2017-09-18 DIAGNOSIS — Z7189 Other specified counseling: Secondary | ICD-10-CM | POA: Diagnosis not present

## 2017-09-18 DIAGNOSIS — Z1389 Encounter for screening for other disorder: Secondary | ICD-10-CM | POA: Diagnosis not present

## 2017-11-12 DIAGNOSIS — R062 Wheezing: Secondary | ICD-10-CM | POA: Diagnosis not present

## 2017-11-12 DIAGNOSIS — R05 Cough: Secondary | ICD-10-CM | POA: Diagnosis not present

## 2017-11-12 DIAGNOSIS — Z1389 Encounter for screening for other disorder: Secondary | ICD-10-CM | POA: Diagnosis not present

## 2017-11-12 DIAGNOSIS — J3489 Other specified disorders of nose and nasal sinuses: Secondary | ICD-10-CM | POA: Diagnosis not present

## 2017-11-12 DIAGNOSIS — J019 Acute sinusitis, unspecified: Secondary | ICD-10-CM | POA: Diagnosis not present

## 2017-11-12 DIAGNOSIS — Z681 Body mass index (BMI) 19 or less, adult: Secondary | ICD-10-CM | POA: Diagnosis not present

## 2017-12-06 DIAGNOSIS — T1511XA Foreign body in conjunctival sac, right eye, initial encounter: Secondary | ICD-10-CM | POA: Diagnosis not present

## 2018-01-10 DIAGNOSIS — Z1389 Encounter for screening for other disorder: Secondary | ICD-10-CM | POA: Diagnosis not present

## 2018-01-10 DIAGNOSIS — T148XXA Other injury of unspecified body region, initial encounter: Secondary | ICD-10-CM | POA: Diagnosis not present

## 2018-01-10 DIAGNOSIS — Z681 Body mass index (BMI) 19 or less, adult: Secondary | ICD-10-CM | POA: Diagnosis not present

## 2018-01-10 DIAGNOSIS — Z719 Counseling, unspecified: Secondary | ICD-10-CM | POA: Diagnosis not present

## 2018-05-23 DIAGNOSIS — Z1389 Encounter for screening for other disorder: Secondary | ICD-10-CM | POA: Diagnosis not present

## 2018-05-23 DIAGNOSIS — J069 Acute upper respiratory infection, unspecified: Secondary | ICD-10-CM | POA: Diagnosis not present

## 2018-05-23 DIAGNOSIS — Z681 Body mass index (BMI) 19 or less, adult: Secondary | ICD-10-CM | POA: Diagnosis not present

## 2018-05-26 ENCOUNTER — Encounter (HOSPITAL_COMMUNITY): Payer: Self-pay

## 2018-05-26 ENCOUNTER — Emergency Department (HOSPITAL_COMMUNITY): Payer: BLUE CROSS/BLUE SHIELD

## 2018-05-26 ENCOUNTER — Emergency Department (HOSPITAL_COMMUNITY)
Admission: EM | Admit: 2018-05-26 | Discharge: 2018-05-27 | Disposition: A | Discharge status: Home / Self Care | Payer: BLUE CROSS/BLUE SHIELD | Attending: Emergency Medicine | Admitting: Emergency Medicine

## 2018-05-26 DIAGNOSIS — K529 Noninfective gastroenteritis and colitis, unspecified: Secondary | ICD-10-CM

## 2018-05-26 DIAGNOSIS — Y999 Unspecified external cause status: Secondary | ICD-10-CM | POA: Diagnosis not present

## 2018-05-26 DIAGNOSIS — S0990XA Unspecified injury of head, initial encounter: Secondary | ICD-10-CM | POA: Insufficient documentation

## 2018-05-26 DIAGNOSIS — Y9389 Activity, other specified: Secondary | ICD-10-CM | POA: Diagnosis not present

## 2018-05-26 DIAGNOSIS — R197 Diarrhea, unspecified: Secondary | ICD-10-CM | POA: Insufficient documentation

## 2018-05-26 DIAGNOSIS — Z23 Encounter for immunization: Secondary | ICD-10-CM | POA: Diagnosis not present

## 2018-05-26 DIAGNOSIS — S0033XA Contusion of nose, initial encounter: Secondary | ICD-10-CM | POA: Diagnosis not present

## 2018-05-26 DIAGNOSIS — S0121XA Laceration without foreign body of nose, initial encounter: Secondary | ICD-10-CM | POA: Insufficient documentation

## 2018-05-26 DIAGNOSIS — Y92008 Other place in unspecified non-institutional (private) residence as the place of occurrence of the external cause: Secondary | ICD-10-CM | POA: Diagnosis not present

## 2018-05-26 DIAGNOSIS — R55 Syncope and collapse: Secondary | ICD-10-CM | POA: Insufficient documentation

## 2018-05-26 DIAGNOSIS — R112 Nausea with vomiting, unspecified: Secondary | ICD-10-CM

## 2018-05-26 LAB — BASIC METABOLIC PANEL
Anion gap: 11 (ref 5–15)
BUN: 16 mg/dL (ref 6–20)
CO2: 22 mmol/L (ref 22–32)
Calcium: 8.6 mg/dL — ABNORMAL LOW (ref 8.9–10.3)
Chloride: 107 mmol/L (ref 98–111)
Creatinine, Ser: 1.26 mg/dL — ABNORMAL HIGH (ref 0.61–1.24)
GFR calc Af Amer: >60 mL/min (ref >60)
GFR calc non Af Amer: >60 mL/min (ref >60)
Glucose, Bld: 119 mg/dL — ABNORMAL HIGH (ref 70–99)
Potassium: 3.7 mmol/L (ref 3.5–5.1)
Sodium: 140 mmol/L (ref 135–145)

## 2018-05-26 LAB — CBC
HCT: 41.4 % (ref 39.0–52.0)
Hemoglobin: 13.7 g/dL (ref 13.0–17.0)
MCH: 31.6 pg (ref 26.0–34.0)
MCHC: 33.1 g/dL (ref 30.0–36.0)
MCV: 95.4 fL (ref 78.0–100.0)
Platelets: 296 10*3/uL (ref 150–400)
RBC: 4.34 MIL/uL (ref 4.22–5.81)
RDW: 11.6 % (ref 11.5–15.5)
WBC: 12.7 10*3/uL — ABNORMAL HIGH (ref 4.0–10.5)

## 2018-05-26 LAB — CBG MONITORING, ED: Glucose-Capillary: 120 mg/dL — ABNORMAL HIGH (ref 70–99)

## 2018-05-26 MED ORDER — ACETAMINOPHEN 500 MG PO TABS
1000.0000 mg | ORAL_TABLET | Freq: Once | ORAL | Status: AC
  Administered 2018-05-26: 1000 mg via ORAL
  Filled 2018-05-26: qty 2

## 2018-05-26 MED ORDER — SODIUM CHLORIDE 0.9 % IV BOLUS
1000.0000 mL | Freq: Once | INTRAVENOUS | Status: AC
  Administered 2018-05-26: 1000 mL via INTRAVENOUS

## 2018-05-26 MED ORDER — CEPHALEXIN 250 MG PO CAPS
500.0000 mg | ORAL_CAPSULE | Freq: Once | ORAL | Status: AC
  Administered 2018-05-26: 500 mg via ORAL
  Filled 2018-05-26: qty 2

## 2018-05-26 MED ORDER — LIDOCAINE-EPINEPHRINE (PF) 2 %-1:200000 IJ SOLN
10.0000 mL | Freq: Once | INTRAMUSCULAR | Status: AC
  Administered 2018-05-26: 10 mL
  Filled 2018-05-26: qty 20

## 2018-05-26 MED ORDER — TETANUS-DIPHTH-ACELL PERTUSSIS 5-2.5-18.5 LF-MCG/0.5 IM SUSP
0.5000 mL | Freq: Once | INTRAMUSCULAR | Status: AC
  Administered 2018-05-26: 0.5 mL via INTRAMUSCULAR
  Filled 2018-05-26: qty 0.5

## 2018-05-26 MED ORDER — CEPHALEXIN 500 MG PO CAPS: 500.0000 mg | ORAL_CAPSULE | Freq: Four times a day (QID) | ORAL | 0 refills | Status: DC

## 2018-05-26 NOTE — Discharge Instructions (Addendum)
It was our pleasure to provide your ER care today - we hope that you feel better.  Rest. Drink plenty of fluids.   Icepack/coldpack to sore area. Keep sutured wound area very clean. Have sutures removed, your doctor or urgent care, in 1 week. Take antibiotic (keflex) as prescribed. Take acetaminophen as need.   Return to ER if worse, new symptoms, persistent vomiting, new or  severe pain, infection of wound, other concern.
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
## 2018-05-26 NOTE — ED Notes (Signed)
Pt now wanting pain medication for back, nose, and teeth pain, steinl made aware
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
## 2018-05-27 NOTE — ED Notes (Signed)
PT states understanding of care given, follow up care, and medication prescribed. PT ambulated from ED to car with a steady gait. 

## 2018-06-03 DIAGNOSIS — Z681 Body mass index (BMI) 19 or less, adult: Secondary | ICD-10-CM | POA: Diagnosis not present

## 2018-06-03 DIAGNOSIS — S0121XD Laceration without foreign body of nose, subsequent encounter: Secondary | ICD-10-CM | POA: Diagnosis not present

## 2018-06-03 DIAGNOSIS — Z1389 Encounter for screening for other disorder: Secondary | ICD-10-CM | POA: Diagnosis not present

## 2018-06-07 DIAGNOSIS — Z1389 Encounter for screening for other disorder: Secondary | ICD-10-CM | POA: Diagnosis not present

## 2018-06-07 DIAGNOSIS — Z681 Body mass index (BMI) 19 or less, adult: Secondary | ICD-10-CM | POA: Diagnosis not present

## 2018-06-07 DIAGNOSIS — W57XXXA Bitten or stung by nonvenomous insect and other nonvenomous arthropods, initial encounter: Secondary | ICD-10-CM | POA: Diagnosis not present

## 2018-06-07 DIAGNOSIS — T07XXXA Unspecified multiple injuries, initial encounter: Secondary | ICD-10-CM | POA: Diagnosis not present

## 2018-06-21 DIAGNOSIS — R5383 Other fatigue: Secondary | ICD-10-CM | POA: Diagnosis not present

## 2018-06-21 DIAGNOSIS — M791 Myalgia, unspecified site: Secondary | ICD-10-CM | POA: Diagnosis not present

## 2018-06-21 DIAGNOSIS — R531 Weakness: Secondary | ICD-10-CM | POA: Diagnosis not present

## 2018-06-21 DIAGNOSIS — R7309 Other abnormal glucose: Secondary | ICD-10-CM | POA: Diagnosis not present

## 2018-06-21 DIAGNOSIS — Z681 Body mass index (BMI) 19 or less, adult: Secondary | ICD-10-CM | POA: Diagnosis not present

## 2018-07-02 DIAGNOSIS — K589 Irritable bowel syndrome without diarrhea: Secondary | ICD-10-CM | POA: Diagnosis not present

## 2018-07-02 DIAGNOSIS — R5383 Other fatigue: Secondary | ICD-10-CM | POA: Diagnosis not present

## 2018-07-02 DIAGNOSIS — Z1389 Encounter for screening for other disorder: Secondary | ICD-10-CM | POA: Diagnosis not present

## 2018-07-02 DIAGNOSIS — Z681 Body mass index (BMI) 19 or less, adult: Secondary | ICD-10-CM | POA: Diagnosis not present

## 2018-08-29 ENCOUNTER — Ambulatory Visit: Payer: BLUE CROSS/BLUE SHIELD | Admitting: Neurology

## 2019-02-12 DIAGNOSIS — R5383 Other fatigue: Secondary | ICD-10-CM | POA: Diagnosis not present

## 2019-02-12 DIAGNOSIS — F419 Anxiety disorder, unspecified: Secondary | ICD-10-CM | POA: Diagnosis not present

## 2020-05-18 DIAGNOSIS — J069 Acute upper respiratory infection, unspecified: Secondary | ICD-10-CM | POA: Diagnosis not present

## 2020-05-18 DIAGNOSIS — F419 Anxiety disorder, unspecified: Secondary | ICD-10-CM | POA: Diagnosis not present

## 2020-05-18 DIAGNOSIS — Z1389 Encounter for screening for other disorder: Secondary | ICD-10-CM | POA: Diagnosis not present

## 2020-08-13 DIAGNOSIS — J069 Acute upper respiratory infection, unspecified: Secondary | ICD-10-CM | POA: Diagnosis not present

## 2020-08-20 DIAGNOSIS — Z681 Body mass index (BMI) 19 or less, adult: Secondary | ICD-10-CM | POA: Diagnosis not present

## 2020-08-20 DIAGNOSIS — J069 Acute upper respiratory infection, unspecified: Secondary | ICD-10-CM | POA: Diagnosis not present

## 2020-08-20 DIAGNOSIS — J019 Acute sinusitis, unspecified: Secondary | ICD-10-CM | POA: Diagnosis not present

## 2020-09-23 DIAGNOSIS — Z681 Body mass index (BMI) 19 or less, adult: Secondary | ICD-10-CM | POA: Diagnosis not present

## 2020-09-23 DIAGNOSIS — N200 Calculus of kidney: Secondary | ICD-10-CM | POA: Diagnosis not present

## 2020-10-13 DIAGNOSIS — J069 Acute upper respiratory infection, unspecified: Secondary | ICD-10-CM | POA: Diagnosis not present

## 2020-10-18 DIAGNOSIS — U071 COVID-19: Secondary | ICD-10-CM | POA: Diagnosis not present

## 2020-11-11 DIAGNOSIS — K529 Noninfective gastroenteritis and colitis, unspecified: Secondary | ICD-10-CM | POA: Diagnosis not present

## 2020-11-11 DIAGNOSIS — R197 Diarrhea, unspecified: Secondary | ICD-10-CM | POA: Diagnosis not present

## 2020-11-11 DIAGNOSIS — K589 Irritable bowel syndrome without diarrhea: Secondary | ICD-10-CM | POA: Diagnosis not present

## 2020-11-18 DIAGNOSIS — Z681 Body mass index (BMI) 19 or less, adult: Secondary | ICD-10-CM | POA: Diagnosis not present

## 2020-11-18 DIAGNOSIS — J069 Acute upper respiratory infection, unspecified: Secondary | ICD-10-CM | POA: Diagnosis not present

## 2020-12-07 ENCOUNTER — Other Ambulatory Visit (HOSPITAL_COMMUNITY): Payer: Self-pay | Admitting: Family Medicine

## 2020-12-07 ENCOUNTER — Other Ambulatory Visit: Payer: Self-pay

## 2020-12-07 ENCOUNTER — Ambulatory Visit (HOSPITAL_COMMUNITY)
Admission: RE | Admit: 2020-12-07 | Discharge: 2020-12-07 | Disposition: A | Discharge status: Home / Self Care | Payer: BLUE CROSS/BLUE SHIELD | Source: Ambulatory Visit | Attending: Family Medicine | Admitting: Family Medicine

## 2020-12-07 DIAGNOSIS — R059 Cough, unspecified: Secondary | ICD-10-CM

## 2020-12-07 DIAGNOSIS — Z1331 Encounter for screening for depression: Secondary | ICD-10-CM | POA: Diagnosis not present

## 2020-12-07 DIAGNOSIS — Z131 Encounter for screening for diabetes mellitus: Secondary | ICD-10-CM | POA: Diagnosis not present

## 2020-12-07 DIAGNOSIS — Z681 Body mass index (BMI) 19 or less, adult: Secondary | ICD-10-CM | POA: Diagnosis not present

## 2020-12-07 DIAGNOSIS — J069 Acute upper respiratory infection, unspecified: Secondary | ICD-10-CM | POA: Diagnosis not present

## 2020-12-07 DIAGNOSIS — F17200 Nicotine dependence, unspecified, uncomplicated: Secondary | ICD-10-CM | POA: Diagnosis not present

## 2020-12-07 DIAGNOSIS — J449 Chronic obstructive pulmonary disease, unspecified: Secondary | ICD-10-CM | POA: Diagnosis not present

## 2020-12-17 ENCOUNTER — Emergency Department (HOSPITAL_COMMUNITY)
Admission: EM | Admit: 2020-12-17 | Discharge: 2020-12-17 | Disposition: A | Discharge status: Home / Self Care | Payer: BC Managed Care – PPO | Attending: Emergency Medicine | Admitting: Emergency Medicine

## 2020-12-17 ENCOUNTER — Other Ambulatory Visit: Payer: Self-pay

## 2020-12-17 ENCOUNTER — Encounter (HOSPITAL_COMMUNITY): Payer: Self-pay

## 2020-12-17 ENCOUNTER — Emergency Department (HOSPITAL_COMMUNITY): Payer: BC Managed Care – PPO

## 2020-12-17 DIAGNOSIS — J069 Acute upper respiratory infection, unspecified: Secondary | ICD-10-CM | POA: Diagnosis not present

## 2020-12-17 DIAGNOSIS — L539 Erythematous condition, unspecified: Secondary | ICD-10-CM | POA: Insufficient documentation

## 2020-12-17 DIAGNOSIS — R0602 Shortness of breath: Secondary | ICD-10-CM | POA: Insufficient documentation

## 2020-12-17 DIAGNOSIS — Z20822 Contact with and (suspected) exposure to covid-19: Secondary | ICD-10-CM | POA: Insufficient documentation

## 2020-12-17 DIAGNOSIS — J449 Chronic obstructive pulmonary disease, unspecified: Secondary | ICD-10-CM | POA: Insufficient documentation

## 2020-12-17 DIAGNOSIS — R0981 Nasal congestion: Secondary | ICD-10-CM | POA: Diagnosis not present

## 2020-12-17 DIAGNOSIS — E860 Dehydration: Secondary | ICD-10-CM | POA: Diagnosis not present

## 2020-12-17 DIAGNOSIS — F17210 Nicotine dependence, cigarettes, uncomplicated: Secondary | ICD-10-CM | POA: Insufficient documentation

## 2020-12-17 DIAGNOSIS — R059 Cough, unspecified: Secondary | ICD-10-CM | POA: Diagnosis not present

## 2020-12-17 LAB — CBC WITH DIFFERENTIAL/PLATELET
Abs Immature Granulocytes: 0.03 10*3/uL (ref 0.00–0.07)
Basophils Absolute: 0 10*3/uL (ref 0.0–0.1)
Basophils Relative: 1 %
Eosinophils Absolute: 0.1 10*3/uL (ref 0.0–0.5)
Eosinophils Relative: 1 %
HCT: 47.1 % (ref 39.0–52.0)
Hemoglobin: 16.3 g/dL (ref 13.0–17.0)
Immature Granulocytes: 0 %
Lymphocytes Relative: 14 %
Lymphs Abs: 0.9 10*3/uL (ref 0.7–4.0)
MCH: 32.3 pg (ref 26.0–34.0)
MCHC: 34.6 g/dL (ref 30.0–36.0)
MCV: 93.3 fL (ref 80.0–100.0)
Monocytes Absolute: 0.7 10*3/uL (ref 0.1–1.0)
Monocytes Relative: 11 %
Neutro Abs: 4.9 10*3/uL (ref 1.7–7.7)
Neutrophils Relative %: 73 %
Platelets: 297 10*3/uL (ref 150–400)
RBC: 5.05 MIL/uL (ref 4.22–5.81)
RDW: 11.8 % (ref 11.5–15.5)
WBC: 6.7 10*3/uL (ref 4.0–10.5)
nRBC: 0 % (ref 0.0–0.2)

## 2020-12-17 LAB — COMPREHENSIVE METABOLIC PANEL
ALT: 11 U/L (ref 0–44)
AST: 18 U/L (ref 15–41)
Albumin: 4.1 g/dL (ref 3.5–5.0)
Alkaline Phosphatase: 54 U/L (ref 38–126)
Anion gap: 10 (ref 5–15)
BUN: 19 mg/dL (ref 6–20)
CO2: 23 mmol/L (ref 22–32)
Calcium: 8.9 mg/dL (ref 8.9–10.3)
Chloride: 103 mmol/L (ref 98–111)
Creatinine, Ser: 0.96 mg/dL (ref 0.61–1.24)
GFR, Estimated: >60 mL/min (ref >60)
Glucose, Bld: 110 mg/dL — ABNORMAL HIGH (ref 70–99)
Potassium: 3.4 mmol/L — ABNORMAL LOW (ref 3.5–5.1)
Sodium: 136 mmol/L (ref 135–145)
Total Bilirubin: 3.6 mg/dL — ABNORMAL HIGH (ref 0.3–1.2)
Total Protein: 6.5 g/dL (ref 6.5–8.1)

## 2020-12-17 LAB — BRAIN NATRIURETIC PEPTIDE: B Natriuretic Peptide: 31 pg/mL (ref 0.0–100.0)

## 2020-12-17 LAB — SARS CORONAVIRUS 2 (TAT 6-24 HRS): SARS Coronavirus 2: NEGATIVE

## 2020-12-17 MED ORDER — IPRATROPIUM-ALBUTEROL 20-100 MCG/ACT IN AERS: 1.0000 | INHALATION_SPRAY | Freq: Once | RESPIRATORY_TRACT | Status: DC

## 2020-12-17 MED ORDER — IPRATROPIUM-ALBUTEROL 20-100 MCG/ACT IN AERS
1.0000 | INHALATION_SPRAY | Freq: Four times a day (QID) | RESPIRATORY_TRACT | Status: DC
  Administered 2020-12-17: 1 via RESPIRATORY_TRACT
  Filled 2020-12-17: qty 4

## 2020-12-17 MED ORDER — IPRATROPIUM BROMIDE HFA 17 MCG/ACT IN AERS: 2.0000 | INHALATION_SPRAY | Freq: Once | RESPIRATORY_TRACT | Status: DC

## 2020-12-17 MED ORDER — AEROCHAMBER PLUS FLO-VU MISC
1.0000 | Freq: Once | Status: AC
  Administered 2020-12-17: 1

## 2020-12-17 MED ORDER — SODIUM CHLORIDE 0.9 % IV BOLUS
500.0000 mL | Freq: Once | INTRAVENOUS | Status: AC
  Administered 2020-12-17: 500 mL via INTRAVENOUS

## 2020-12-17 MED ORDER — PREDNISONE 10 MG PO TABS: 50.0000 mg | ORAL_TABLET | Freq: Every day | ORAL | 0 refills | Status: AC

## 2020-12-17 MED ORDER — ALBUTEROL SULFATE HFA 108 (90 BASE) MCG/ACT IN AERS: 2.0000 | INHALATION_SPRAY | Freq: Once | RESPIRATORY_TRACT | Status: DC

## 2020-12-17 MED ORDER — FLUTICASONE PROPIONATE 50 MCG/ACT NA SUSP: 1.0000 | Freq: Every day | NASAL | 2 refills | Status: AC

## 2020-12-17 NOTE — ED Triage Notes (Signed)
Pt has been to his primary Dr several times for medications for nasal congestion, cough. Pt feels he may be dehydrated from diarrhea started this morning.

## 2020-12-17 NOTE — ED Notes (Signed)
RT aware of orders 

## 2020-12-17 NOTE — Discharge Instructions (Addendum)
Use albuterol inhaler 1 to 2 puffs every 4-6 hours as needed for shortness of breath, cough, wheezing. Use Atrovent inhaler 1 puff twice daily.  Take prednisone daily as prescribed and complete the full course. Start Flonase twice daily for the first 5 days then continue with daily use. Can also take Zyrtec daily.   Follow-up with your doctor for recheck and further management.  Follow-up with ENT, call to schedule appointment.

## 2020-12-17 NOTE — ED Provider Notes (Signed)
[facility] EMERGENCY DEPARTMENT Provider Note   CSN: [id number] Arrival date & time: 12/17/20  0762     History Chief Complaint  Patient presents with  . Nasal Congestion    [name] is a 55 y.o. male.  55 year old male with history of GERD, IBS, daily smoker, presents with complaint of sinus congestion and drainage x 2 months. Multiple visits to PCP, treated with Clindamycin and Cipro, prednisone, various cough syrups without any improvement. Patient tried to return to work today but states he is so fatigued, [facility] and feels his heart race with exertion he was forced to leave work and came to the ER for care. Reports diarrhea onset today, states he has intermittent diarrhea which he relates to his IBS, no change in typical symptoms. Denies orthopnea, history of CHF. States he wakes up with significant sinus congestion and PND.   Did have a negative COVID test 2 weeks ago.   [name] was evaluated in Emergency Department on 12/17/2020 for the symptoms described in the history of present illness. He was evaluated in the context of the global COVID-19 pandemic, which necessitated consideration that the patient might be at risk for infection with the SARS-CoV-2 virus that causes COVID-19. Institutional protocols and algorithms that pertain to the evaluation of patients at risk for COVID-19 are in a state of rapid change based on information released by regulatory bodies including the CDC and federal and state organizations. These policies and algorithms were followed during the patient's care in the ED.         Past Medical History:  Diagnosis Date  . Body mass index (BMI) of 19 or less in adult 2007 117 LBS  . GERD (gastroesophageal reflux disease)   . IBS (irritable bowel syndrome)   . IBS (irritable bowel syndrome)     Patient Active Problem List   Diagnosis Date Noted  . SMOKER 07/05/2009  . GERD 07/05/2009  . IRRITABLE BOWEL SYNDROME 07/05/2009    Past  Surgical History:  Procedure Laterality Date  . ESOPHAGOGASTRODUODENOSCOPY  SEP 2007 EPIG PAIN, DIARRHEA   MILD GASTRITIS/DUODENITIS, NO CELIAC SPRUE, HH,  SCHATZKI'S RING       Family History  Problem Relation Age of Onset  . Colon cancer Neg Hx   . Colon polyps Neg Hx     Social History   Tobacco Use  . Smoking status: Current Every Day Smoker    Packs/day: 1.00    Types: Cigarettes  . Smokeless tobacco: Never Used  Substance Use Topics  . Alcohol use: Not Currently    Comment:  quit 25 yrs ago  . Drug use: Yes    Types: Marijuana    Comment: occasionally    Home Medications Prior to Admission medications   Medication Sig Start Date End Date Taking? Authorizing Provider  dextromethorphan-guaiFENesin (MUCINEX DM) 30-600 MG 12hr tablet Take 1 tablet by mouth 2 (two) times daily as needed for cough.   Yes [provider]  fluticasone (FLONASE) 50 MCG/ACT nasal spray Place 1 spray into both nostrils daily. 12/17/20  Yes Jeannie Fend, PA-C  predniSONE (DELTASONE) 10 MG tablet Take 5 tablets (50 mg total) by mouth daily for 5 days. 12/17/20 12/22/20 Yes Jeannie Fend, PA-C  pantoprazole (PROTONIX) 40 MG tablet TAKE 1 TABLET (40 MG TOTAL) BY MOUTH DAILY. Patient not taking: No sig reported 02/04/12   Tiffany Kocher, PA-C  dexlansoprazole (DEXILANT) 60 MG capsule Take 1 capsule (60 mg total) by  mouth daily. Patient not taking: Reported on 12/17/2020 10/25/11 12/17/20  West Bali, MD  dicyclomine (BENTYL) 10 MG capsule 1 PO 30 MINUTES PRIOR TO BREAKFAST AND LUNCH 10/25/11 12/17/20  West Bali, MD    Allergies    Codeine  Review of Systems   Review of Systems  Constitutional: Positive for fatigue. Negative for chills, diaphoresis and fever.  HENT: Positive for congestion and postnasal drip.   Respiratory: Positive for cough and shortness of breath.   Cardiovascular: Negative for chest pain.  Gastrointestinal: Positive for diarrhea. Negative for abdominal pain,  blood in stool, nausea and vomiting.  Genitourinary: Negative for decreased urine volume and difficulty urinating.  Musculoskeletal: Negative for arthralgias and myalgias.  Skin: Negative for rash and wound.  Allergic/Immunologic: Negative for immunocompromised state.  Neurological: Negative for weakness.  Hematological: Negative for adenopathy.  Psychiatric/Behavioral: Negative for confusion.  All other systems reviewed and are negative.   Physical Exam Updated Vital Signs BP 115/80   Pulse 83   Temp 98.6 F (37 C) (Oral)   Resp (!) 24   Ht 5\' 8"  (1.727 m)   Wt 47.6 kg   SpO2 95%   BMI 15.97 kg/m   Physical Exam Vitals and nursing note reviewed.  Constitutional:      General: He is not in acute distress.    Appearance: He is well-developed. He is not diaphoretic.  HENT:     Head: Normocephalic and atraumatic.     Nose: Congestion present.     Mouth/Throat:     Pharynx: Posterior oropharyngeal erythema present. No oropharyngeal exudate.  Eyes:     Conjunctiva/sclera: Conjunctivae normal.  Cardiovascular:     Rate and Rhythm: Normal rate and regular rhythm.     Pulses: Normal pulses.     Heart sounds: Normal heart sounds.  Pulmonary:     Effort: Pulmonary effort is normal.     Breath sounds: Normal breath sounds.  Abdominal:     Palpations: Abdomen is soft.     Tenderness: There is no abdominal tenderness.  Musculoskeletal:     Right lower leg: No edema.     Left lower leg: No edema.  Lymphadenopathy:     Cervical: No cervical adenopathy.  Skin:    General: Skin is warm and dry.     Findings: No erythema or rash.  Neurological:     Mental Status: He is alert and oriented to person, place, and time.  Psychiatric:        Behavior: Behavior normal.     ED Results / Procedures / Treatments   Labs (all labs ordered are listed, but only abnormal results are displayed) Labs Reviewed  COMPREHENSIVE METABOLIC PANEL - Abnormal; Notable for the following  components:      Result Value   Potassium 3.4 (*)    Glucose, Bld 110 (*)    Total Bilirubin 3.6 (*)    All other components within normal limits  SARS CORONAVIRUS 2 (TAT 6-24 HRS)  CBC WITH DIFFERENTIAL/PLATELET  BRAIN NATRIURETIC PEPTIDE    EKG EKG Interpretation  Date/Time:  Friday December 17 2020 09:32:24 EDT Ventricular Rate:  91 PR Interval:  115 QRS Duration: 87 QT Interval:  362 QTC Calculation: 446 R Axis:   90 Text Interpretation: Sinus rhythm Borderline short PR interval Biatrial enlargement Anteroseptal infarct, age indeterminate Confirmed by 04-05-1978 [id number]) on 12/17/2020 9:45:58 AM   Radiology DG Chest Port 1 View  Result Date: 12/17/2020 CLINICAL DATA:  Cough, shortness of  breath, dehydration EXAM: PORTABLE CHEST 1 VIEW COMPARISON:  Portable exam 0944 hours compared to 12/07/2020 FINDINGS: Normal heart size, mediastinal contours, and pulmonary vascularity. Emphysematous and bronchitic changes consistent with COPD. No acute infiltrate, pleural effusion, or pneumothorax. Osseous structures unremarkable. IMPRESSION: COPD changes. No acute abnormalities. Electronically Signed   By: Ulyses Southward M.D.   On: 12/17/2020 10:09    Procedures Procedures   Medications Ordered in ED Medications  Ipratropium-Albuterol (COMBIVENT) respimat 1 puff (has no administration in time range)  sodium chloride 0.9 % bolus 500 mL (0 mLs Intravenous Stopped 12/17/20 1024)  aerochamber plus with mask device 1 each (1 each Other Given 12/17/20 1149)    ED Course  I have reviewed the triage vital signs and the nursing notes.  Pertinent labs & imaging results that were available during my care of the patient were reviewed by me and considered in my medical decision making (see chart for details).  Clinical Course as of 12/17/20 1346  Fri Dec 17, 2020  5125 55 year old male with complaint of nasal drainage and congestion as well as fatigue and shortness of breath.  Symptoms started 2  months ago with his sinus congestion, has been to PCP multiple times and treated with Cipro and clindamycin as well as prednisone without improvement in his symptoms. Patient had to return to work today however due to his fatigue he was unable to work and came to the emergency room for evaluation. On exam, patient appears chronically ill, is thin, he is a daily smoker.  Lung sounds with diminished bases.  CBC and CMP without significant findings, BNP reassuring at 31.  Covid test is pending.  Chest x-ray shows COPD.  Patient was given albuterol inhaler in the emergency room and he reports significant improvement afterwards. Patient is discharged with albuterol inhaler and instructions for use, will give short course of prednisone for COPD exacerbation.  Advised to use Flonase and Zyrtec daily, referred to ENT for his 2 months long sinus congestion not improving with antibiotics. [LM]    Clinical Course User Index [LM] Alden Hipp   MDM Rules/Calculators/A&P                          Final Clinical Impression(s) / ED Diagnoses Final diagnoses:  Chronic obstructive pulmonary disease, unspecified COPD type (HCC)  Viral upper respiratory tract infection    Rx / DC Orders ED Discharge Orders         Ordered    fluticasone (FLONASE) 50 MCG/ACT nasal spray  Daily        12/17/20 1208    predniSONE (DELTASONE) 10 MG tablet  Daily        12/17/20 1208           Jeannie Fend, PA-C 12/17/20 1346    Vanetta Mulders, MD 12/17/20 [phone]

## 2020-12-24 DIAGNOSIS — J381 Polyp of vocal cord and larynx: Secondary | ICD-10-CM | POA: Diagnosis not present

## 2020-12-24 DIAGNOSIS — J342 Deviated nasal septum: Secondary | ICD-10-CM | POA: Diagnosis not present

## 2020-12-24 DIAGNOSIS — K219 Gastro-esophageal reflux disease without esophagitis: Secondary | ICD-10-CM | POA: Diagnosis not present

## 2020-12-24 DIAGNOSIS — J343 Hypertrophy of nasal turbinates: Secondary | ICD-10-CM | POA: Diagnosis not present

## 2021-01-10 DIAGNOSIS — J309 Allergic rhinitis, unspecified: Secondary | ICD-10-CM | POA: Diagnosis not present

## 2021-01-10 DIAGNOSIS — J320 Chronic maxillary sinusitis: Secondary | ICD-10-CM | POA: Diagnosis not present

## 2021-01-10 DIAGNOSIS — J329 Chronic sinusitis, unspecified: Secondary | ICD-10-CM | POA: Diagnosis not present

## 2021-01-10 DIAGNOSIS — J321 Chronic frontal sinusitis: Secondary | ICD-10-CM | POA: Diagnosis not present

## 2021-01-10 DIAGNOSIS — J381 Polyp of vocal cord and larynx: Secondary | ICD-10-CM | POA: Diagnosis not present

## 2021-01-10 DIAGNOSIS — J342 Deviated nasal septum: Secondary | ICD-10-CM | POA: Diagnosis not present

## 2021-01-10 DIAGNOSIS — J3489 Other specified disorders of nose and nasal sinuses: Secondary | ICD-10-CM | POA: Diagnosis not present

## 2021-01-10 DIAGNOSIS — K219 Gastro-esophageal reflux disease without esophagitis: Secondary | ICD-10-CM | POA: Diagnosis not present

## 2021-02-08 DIAGNOSIS — K582 Mixed irritable bowel syndrome: Secondary | ICD-10-CM | POA: Diagnosis not present

## 2021-02-08 DIAGNOSIS — J449 Chronic obstructive pulmonary disease, unspecified: Secondary | ICD-10-CM | POA: Diagnosis not present

## 2021-02-08 DIAGNOSIS — F17210 Nicotine dependence, cigarettes, uncomplicated: Secondary | ICD-10-CM | POA: Diagnosis not present

## 2021-02-08 DIAGNOSIS — E559 Vitamin D deficiency, unspecified: Secondary | ICD-10-CM | POA: Diagnosis not present

## 2021-02-15 DIAGNOSIS — K582 Mixed irritable bowel syndrome: Secondary | ICD-10-CM | POA: Diagnosis not present

## 2021-02-15 DIAGNOSIS — K219 Gastro-esophageal reflux disease without esophagitis: Secondary | ICD-10-CM | POA: Diagnosis not present

## 2021-03-08 DIAGNOSIS — J019 Acute sinusitis, unspecified: Secondary | ICD-10-CM | POA: Diagnosis not present

## 2021-03-08 DIAGNOSIS — B9689 Other specified bacterial agents as the cause of diseases classified elsewhere: Secondary | ICD-10-CM | POA: Diagnosis not present

## 2021-03-22 DIAGNOSIS — J019 Acute sinusitis, unspecified: Secondary | ICD-10-CM | POA: Diagnosis not present

## 2021-03-22 DIAGNOSIS — B9689 Other specified bacterial agents as the cause of diseases classified elsewhere: Secondary | ICD-10-CM | POA: Diagnosis not present

## 2021-04-11 DIAGNOSIS — R109 Unspecified abdominal pain: Secondary | ICD-10-CM | POA: Diagnosis not present

## 2021-04-12 DIAGNOSIS — N400 Enlarged prostate without lower urinary tract symptoms: Secondary | ICD-10-CM | POA: Diagnosis not present

## 2021-04-12 DIAGNOSIS — R109 Unspecified abdominal pain: Secondary | ICD-10-CM | POA: Diagnosis not present

## 2021-04-12 DIAGNOSIS — N200 Calculus of kidney: Secondary | ICD-10-CM | POA: Diagnosis not present

## 2021-04-13 DIAGNOSIS — N400 Enlarged prostate without lower urinary tract symptoms: Secondary | ICD-10-CM | POA: Diagnosis not present

## 2021-04-13 DIAGNOSIS — R109 Unspecified abdominal pain: Secondary | ICD-10-CM | POA: Diagnosis not present

## 2021-04-13 DIAGNOSIS — N29 Other disorders of kidney and ureter in diseases classified elsewhere: Secondary | ICD-10-CM | POA: Diagnosis not present

## 2021-04-13 DIAGNOSIS — S39012A Strain of muscle, fascia and tendon of lower back, initial encounter: Secondary | ICD-10-CM | POA: Diagnosis not present

## 2021-05-17 DIAGNOSIS — M545 Low back pain, unspecified: Secondary | ICD-10-CM | POA: Diagnosis not present

## 2021-05-17 DIAGNOSIS — G8929 Other chronic pain: Secondary | ICD-10-CM | POA: Diagnosis not present

## 2021-05-17 DIAGNOSIS — R197 Diarrhea, unspecified: Secondary | ICD-10-CM | POA: Diagnosis not present

## 2021-05-17 DIAGNOSIS — K582 Mixed irritable bowel syndrome: Secondary | ICD-10-CM | POA: Diagnosis not present

## 2021-06-10 DIAGNOSIS — J069 Acute upper respiratory infection, unspecified: Secondary | ICD-10-CM | POA: Diagnosis not present

## 2021-07-14 DIAGNOSIS — R197 Diarrhea, unspecified: Secondary | ICD-10-CM | POA: Diagnosis not present

## 2021-07-14 DIAGNOSIS — R110 Nausea: Secondary | ICD-10-CM | POA: Diagnosis not present

## 2021-07-15 DIAGNOSIS — K529 Noninfective gastroenteritis and colitis, unspecified: Secondary | ICD-10-CM | POA: Diagnosis not present

## 2021-07-15 DIAGNOSIS — R197 Diarrhea, unspecified: Secondary | ICD-10-CM | POA: Diagnosis not present

## 2021-08-10 DIAGNOSIS — R059 Cough, unspecified: Secondary | ICD-10-CM | POA: Diagnosis not present

## 2021-08-10 DIAGNOSIS — J00 Acute nasopharyngitis [common cold]: Secondary | ICD-10-CM | POA: Diagnosis not present

## 2021-08-10 DIAGNOSIS — Z20822 Contact with and (suspected) exposure to covid-19: Secondary | ICD-10-CM | POA: Diagnosis not present

## 2021-08-13 DIAGNOSIS — S0502XA Injury of conjunctiva and corneal abrasion without foreign body, left eye, initial encounter: Secondary | ICD-10-CM | POA: Diagnosis not present

## 2021-08-16 DIAGNOSIS — R0981 Nasal congestion: Secondary | ICD-10-CM | POA: Diagnosis not present

## 2021-08-16 DIAGNOSIS — J441 Chronic obstructive pulmonary disease with (acute) exacerbation: Secondary | ICD-10-CM | POA: Diagnosis not present

## 2021-08-16 DIAGNOSIS — R059 Cough, unspecified: Secondary | ICD-10-CM | POA: Diagnosis not present

## 2021-10-21 ENCOUNTER — Other Ambulatory Visit: Payer: Self-pay

## 2021-10-21 ENCOUNTER — Encounter (HOSPITAL_COMMUNITY): Payer: Self-pay | Admitting: Physical Therapy

## 2021-10-21 ENCOUNTER — Ambulatory Visit (HOSPITAL_COMMUNITY): Payer: BC Managed Care – PPO | Attending: Physician Assistant | Admitting: Physical Therapy

## 2021-10-21 DIAGNOSIS — M6281 Muscle weakness (generalized): Secondary | ICD-10-CM | POA: Diagnosis present

## 2021-10-21 DIAGNOSIS — M5442 Lumbago with sciatica, left side: Secondary | ICD-10-CM | POA: Diagnosis present

## 2021-10-21 DIAGNOSIS — M5441 Lumbago with sciatica, right side: Secondary | ICD-10-CM | POA: Diagnosis present

## 2021-10-21 DIAGNOSIS — G8929 Other chronic pain: Secondary | ICD-10-CM | POA: Diagnosis present

## 2021-10-21 NOTE — Therapy (Signed)
[facility] [address] [place], [place], [address] Phone: [phone]   Fax:  [phone]  Physical Therapy Evaluation  Patient Details  Name: [name] MRN: [id number] Date of Birth: 06/08/1966 Referring Provider (PT): Georgette Shell   Encounter Date: 10/21/2021   PT End of Session - 10/21/21 1451     Visit Number 1    Number of Visits 12    Date for PT Re-Evaluation 12/02/21    Authorization Type BCBS    Authorization - Visit Number 1    Authorization - Number of Visits 30    Progress Note Due on Visit 10    PT Start Time 0920    PT Stop Time 1000    PT Time Calculation (min) 40 min    Activity Tolerance Patient tolerated treatment well    Behavior During Therapy [facility] for tasks assessed/performed             Past Medical History:  Diagnosis Date   Body mass index (BMI) of 19 or less in adult 2007 117 LBS   GERD (gastroesophageal reflux disease)    IBS (irritable bowel syndrome)    IBS (irritable bowel syndrome)     Past Surgical History:  Procedure Laterality Date   ESOPHAGOGASTRODUODENOSCOPY  SEP 2007 EPIG PAIN, DIARRHEA   MILD GASTRITIS/DUODENITIS, NO CELIAC SPRUE, HH,  SCHATZKI'S RING    There were no vitals filed for this visit.    Subjective Assessment - 10/21/21 0929     Subjective Pt states that he has had low back pain for years but in August he was moving out of his home, moving a recliner which exacerbated his pain.  He has had CT scans and the two lower disc had some compression.  He states that his tailbone hurts when he sits on it so he has to shift to the right or left.  He states that the pain is mainly in the lower back but will occasionally radiate to his buttock B.    Limitations Sitting;Lifting;Standing;Walking;House hold activities    How long can you sit comfortably? starts swithing weight around almost immediate. Has to get up after an hour.    How long can you stand comfortably? Quickly  starts hurting after five minutes    How long can you walk comfortably? does better    Diagnostic tests CT    Patient Stated Goals less pain    Currently in Pain? Yes    Pain Score 5    highest pain 8/10; best 4/10   Pain Location Back    Pain Orientation Lower    Pain Descriptors / Indicators Aching;Throbbing;Tightness    Pain Type Acute pain    Pain Radiating Towards B buttock    Pain Onset More than a month ago    Aggravating Factors  standing then sitting    Pain Relieving Factors nothing    Effect of Pain on Daily Activities limits                [facility] PT Assessment - 10/21/21 0001       Assessment   Medical Diagnosis back Pain    Referring Provider (PT) Georgette Shell    Onset Date/Surgical Date 04/25/21    Next MD Visit 3/10    Prior Therapy none      Precautions   Precautions None      Restrictions   Weight Bearing Restrictions No      Balance Screen  Has the patient fallen in the past 6 months No    Has the patient had a decrease in activity level because of a fear of falling?  No    Is the patient reluctant to leave their home because of a fear of falling?  No      Home [employer] manag[employer]vironment   Living Environment Private residence    Home Access Stairs to enter    Entrance Stairs-Number of Steps 3      Prior Function   Level of Independence Independent      Cognition   Overall Cognitive Status Within Functional Limits for tasks assessed      Observation/Other Assessments   Focus on Therapeutic Outcomes (FOTO)  49/51% affected      Functional Tests   Functional tests Sit to Stand;Single leg stance      Posture/Postural Control   Posture/Postural Control Postural limitations    Postural Limitations Decreased lumbar lordosis;Decreased thoracic kyphosis   Pt is in a posterior lean     ROM / Strength   AROM / PROM / Strength Strength;AROM      AROM   AROM Assessment Site Lumbar    Lumbar Flexion fingers just past knees   reps no change   Lumbar  Extension 18   reps increase pain   Lumbar - Right Side Bend 12    Lumbar - Left Side Bend 10      Strength   Strength Assessment Site Hip;Knee    Right/Left Hip Right;Left    Right Hip Flexion 3/5    Right Hip Extension 3/5    Right Hip ABduction 3-/5    Left Hip Flexion 3-/5    Left Hip Extension 3/5    Left Hip ABduction 3/5    Right/Left Knee Right;Left    Right Knee Extension 4/5    Left Knee Extension 4+/5                        Objective measurements completed on examination: See above findings.       [facility] Adult PT Treatment/Exercise - 10/21/21 0001       Exercises   Exercises Lumbar      Lumbar Exercises: Stretches   Single Knee to Chest Stretch 3 reps;20 seconds      Lumbar Exercises: Supine   Ab Set 5 reps    Bridge 5 reps      Lumbar Exercises: Prone   Other Prone Lumbar Exercises prone with pillow under abdomen:  10 glut sets, 10 heel squeezes                     PT Education - 10/21/21 1450     Education Details HEP    Person(s) Educated Patient    Methods Explanation;Handout    Comprehension Returned demonstration              PT Short Term Goals - 10/21/21 1611       PT SHORT TERM GOAL #1   Title PT to be I in HEP to have functional back motion    Time 3    Period Weeks    Status New    Target Date 11/11/21      PT SHORT TERM GOAL #2   Title PT to be able to demonstrate proper body mechanics for lifting, pusing pulling  bed mobility to decrease stress off low back and allow pain level to be no greater than 6/10  Time 3    Period Weeks    Status New      PT SHORT TERM GOAL #3   Title Pt LE and core strength to be increase by 1/2 grade to allow pt to tolerate standing x 10 minutes without increased pain.    Period Weeks    Status New    Target Date 11/11/21               PT Long Term Goals - 10/21/21 1614       PT LONG TERM GOAL #1   Title Pt to be I in advanced HEP to improve posture so  that pt does not exhibit a posterior lean when standing    Time 6    Period Weeks    Status New    Target Date 12/02/21      PT LONG TERM GOAL #2   Title Pt to state that he is able to complete a full day work without low back pain going above a 2/10    Time 6    Period Weeks    Status New      PT LONG TERM GOAL #3   Title Pt LE to increase one grade to be able to come sit to stand without increased back pain.    Time 6    Period Weeks    Status New      PT LONG TERM GOAL #4   Title Pt core strength and knowledge of body mechanics to increase to allow pt to be able to work on his cars for an hour without increased back pain.    Time 6    Period Weeks    Status New                    Plan - 10/21/21 1558     Clinical Impression Statement Mr. Bartholomew is a 56 yo male who has had chronic back issues on and off for years, however, he was moving in August and aggrevated his back which has taken his pain to another level.  He has had a CT scan which demonstrates bulged discs at two levels. He is being referred to skilled PT.  Evaluation demonstates decreased ROM, decreased strength, decreased activity tolerance, poor body mechanics and increased pain.  Mr. Evitts will benefit from skilled PT for education on correct body mechanic, stabilization and flexibility exercise to decrease his back pain, improve his strength and functional level.    Personal Factors and Comorbidities Time since onset of injury/illness/exacerbation    Examination-Activity Limitations Bend;Carry;Lift;Stand;Stairs;Squat;Sit    Examination-Participation Restrictions Cleaning;Other;Occupation    Stability/Clinical Decision Making Stable/Uncomplicated    Clinical Decision Making Moderate    Rehab Potential Good    PT Frequency 2x / week    PT Duration 6 weeks    PT Treatment/Interventions Therapeutic exercise;Patient/family education;Functional mobility training;Manual techniques    PT Next Visit Plan Work  on posture, as well as core strengthening,  educate in body mechaincs for lifting, pushing pulling    PT Home Exercise Plan Knee to chest , prone on elbow with 2 pillows under abdomen, ab set, bridge             Patient will benefit from skilled therapeutic intervention in order to improve the following deficits and impairments:  Pain, Decreased strength, Difficulty walking, Postural dysfunction, Improper body mechanics, Decreased range of motion  Visit Diagnosis: Chronic bilateral low back pain with bilateral sciatica  Muscle weakness (generalized)  Problem List Patient Active Problem List   Diagnosis Date Noted   SMOKER 07/05/2009   GERD 07/05/2009   IRRITABLE BOWEL SYNDROME 07/05/2009   Virgina Organ, PT CLT [phone] 10/21/2021, 4:18 PM  [facility] [address] [place], [place], [address] Phone: [phone]   Fax:  [phone]  Name: [name] MRN: [id number] Date of Birth: [birth year]

## 2021-10-26 ENCOUNTER — Ambulatory Visit (HOSPITAL_COMMUNITY): Payer: BC Managed Care – PPO | Admitting: Physical Therapy

## 2021-10-26 ENCOUNTER — Encounter (HOSPITAL_COMMUNITY): Payer: Self-pay | Admitting: Physical Therapy

## 2021-10-26 ENCOUNTER — Other Ambulatory Visit: Payer: Self-pay

## 2021-10-26 DIAGNOSIS — M5442 Lumbago with sciatica, left side: Secondary | ICD-10-CM | POA: Diagnosis not present

## 2021-10-26 DIAGNOSIS — G8929 Other chronic pain: Secondary | ICD-10-CM

## 2021-10-26 DIAGNOSIS — M6281 Muscle weakness (generalized): Secondary | ICD-10-CM

## 2021-10-26 NOTE — Therapy (Signed)
[facility] [address] [place], [place], [address] Phone: [phone]   Fax:  [phone]  Physical Therapy Treatment  Patient Details  Name: [name] MRN: [id number] Date of Birth: 1965-11-25 Referring Provider (PT): Georgette Shell   Encounter Date: 10/26/2021   PT End of Session - 10/26/21 0917     Visit Number 2    Number of Visits 12    Date for PT Re-Evaluation 12/02/21    Authorization Type BCBS    Authorization - Visit Number 2    Authorization - Number of Visits 30    Progress Note Due on Visit 10    PT Start Time 0917    PT Stop Time 1002    PT Time Calculation (min) 45 min    Activity Tolerance Patient tolerated treatment well    Behavior During Therapy [facility] for tasks assessed/performed             Past Medical History:  Diagnosis Date   Body mass index (BMI) of 19 or less in adult 2007 117 LBS   GERD (gastroesophageal reflux disease)    IBS (irritable bowel syndrome)    IBS (irritable bowel syndrome)     Past Surgical History:  Procedure Laterality Date   ESOPHAGOGASTRODUODENOSCOPY  SEP 2007 EPIG PAIN, DIARRHEA   MILD GASTRITIS/DUODENITIS, NO CELIAC SPRUE, HH,  SCHATZKI'S RING    There were no vitals filed for this visit.   Subjective Assessment - 10/26/21 0917     Subjective Patient states back is about the same. Not as bad today. Has been working on [employer]. Hasnt noticed much change with any specific exercise.    Limitations Sitting;Lifting;Standing;Walking;House hold activities    How long can you sit comfortably? starts swithing weight around almost immediate. Has to get up after an hour.    How long can you stand comfortably? Quickly starts hurting after five minutes    How long can you walk comfortably? does better    Diagnostic tests CT    Patient Stated Goals less pain    Currently in Pain? Yes    Pain Score 4     Pain Location Back    Pain Orientation Lower    Pain Descriptors /  Indicators Aching    Pain Type Chronic pain    Pain Onset More than a month ago    Pain Frequency Constant                               [facility] Adult PT Treatment/Exercise - 10/26/21 0001       Lumbar Exercises: Stretches   Single Knee to Chest Stretch Right;Left;3 reps;20 seconds    Other Lumbar Stretch Exercise LTR 10x 5 second holds bilateral      Lumbar Exercises: Supine   Ab Set 10 reps;5 seconds    Bridge 10 reps    Other Supine Lumbar Exercises DKTC with green ball 10 x 5 second holds      Lumbar Exercises: Prone   Other Prone Lumbar Exercises prone lying-3 minutes - increase in symptoms, prone lying with pillows under abdomen with exercises-   10 heel squeezes with 5 second holds                     PT Education - 10/26/21 0917     Education Details HEP, Dry needling    Person(s) Educated Patient    Methods  Explanation;Demonstration;Handout    Comprehension Verbalized understanding;Returned demonstration              PT Short Term Goals - 10/26/21 0932       PT SHORT TERM GOAL #1   Title PT to be I in HEP to have functional back motion    Time 3    Period Weeks    Status On-going    Target Date 11/11/21      PT SHORT TERM GOAL #2   Title PT to be able to demonstrate proper body mechanics for lifting, pusing pulling  bed mobility to decrease stress off low back and allow pain level to be no greater than 6/10    Time 3    Period Weeks    Status On-going      PT SHORT TERM GOAL #3   Title Pt LE and core strength to be increase by 1/2 grade to allow pt to tolerate standing x 10 minutes without increased pain.    Period Weeks    Status On-going    Target Date 11/11/21               PT Long Term Goals - 10/26/21 0932       PT LONG TERM GOAL #1   Title Pt to be I in advanced HEP to improve posture so that pt does not exhibit a posterior lean when standing    Time 6    Period Weeks    Status On-going    Target  Date 12/02/21      PT LONG TERM GOAL #2   Title Pt to state that he is able to complete a full day work without low back pain going above a 2/10    Time 6    Period Weeks    Status On-going      PT LONG TERM GOAL #3   Title Pt LE to increase one grade to be able to come sit to stand without increased back pain.    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    Time 6    Period Weeks    Status On-going                   Plan - 10/26/21 1275     Clinical Impression Statement Began session with previously completed exercises with minimal cueing for mechanics and positioning initially. Continued with spinal mobility and core strength. States muscles working with flexion bias exercises but increase in symptoms following. Slight increase in symptoms with prone exercise. Patient not showing directional preference at this time. Patient with hyperactive and tender musculature throughout lumbar spine. Educated on possible benefit from dry needling, educated on intervention and patient with think on it.  Patient will continue to benefit from skilled physical therapy in order to reduce impairment and improve function.    Personal Factors and Comorbidities Time since onset of injury/illness/exacerbation    Examination-Activity Limitations Bend;Carry;Lift;Stand;Stairs;Squat;Sit    Examination-Participation Restrictions Cleaning;Other;Occupation    Stability/Clinical Decision Making Stable/Uncomplicated    Rehab Potential Good    PT Frequency 2x / week    PT Duration 6 weeks    PT Treatment/Interventions Therapeutic exercise;Patient/family education;Functional mobility training;Manual techniques  PT Next Visit Plan Work on posture,  as well as core strengthening,  educate in body mechaincs for lifting, pushing  pulling; pelvic tilts, possibly DN    PT Home Exercise Plan knee to chest,  prone on elbow with 2 pillows under abdomen, ab set, bridge 2/15 LTR             Patient will benefit from skilled therapeutic intervention in order to improve the following deficits and impairments:  Pain, Decreased strength, Difficulty walking, Postural dysfunction, Improper body mechanics, Decreased range of motion  Visit Diagnosis: Chronic bilateral low back pain with bilateral sciatica  Muscle weakness (generalized)     Problem List Patient Active Problem List   Diagnosis Date Noted   SMOKER 07/05/2009   GERD 07/05/2009   IRRITABLE BOWEL SYNDROME 07/05/2009    10:08 AM, 10/26/21 Wyman Songster PT, DPT Physical Therapist at [facility]   [facility] [address] [place], [place], [address] Phone: [phone]   Fax:  [phone]  Name: [name] MRN: [id number] Date of Birth: 19-May-1966

## 2021-10-26 NOTE — Patient Instructions (Addendum)

## 2021-10-27 ENCOUNTER — Ambulatory Visit (HOSPITAL_COMMUNITY): Payer: BC Managed Care – PPO | Admitting: Physical Therapy

## 2021-10-27 DIAGNOSIS — M5441 Lumbago with sciatica, right side: Secondary | ICD-10-CM

## 2021-10-27 DIAGNOSIS — M5442 Lumbago with sciatica, left side: Secondary | ICD-10-CM | POA: Diagnosis not present

## 2021-10-27 DIAGNOSIS — M6281 Muscle weakness (generalized): Secondary | ICD-10-CM

## 2021-10-27 DIAGNOSIS — G8929 Other chronic pain: Secondary | ICD-10-CM

## 2021-10-27 NOTE — Therapy (Signed)
[facility] [address] [place], [place], [address] Phone: [phone]   Fax:  [phone]  Physical Therapy Treatment  Patient Details  Name: [name] MRN: [id number] Date of Birth: Apr 01, 1966 Referring Provider (PT): Georgette Shell   Encounter Date: 10/27/2021   PT End of Session - 10/27/21 1228     Visit Number 3    Number of Visits 12    Date for PT Re-Evaluation 12/02/21    Authorization Type BCBS    Authorization - Visit Number 3    Authorization - Number of Visits 30    Progress Note Due on Visit 10    PT Start Time 1140    PT Stop Time 1220    PT Time Calculation (min) 40 min    Activity Tolerance Patient tolerated treatment well    Behavior During Therapy [facility] for tasks assessed/performed             Past Medical History:  Diagnosis Date   Body mass index (BMI) of 19 or less in adult 2007 117 LBS   GERD (gastroesophageal reflux disease)    IBS (irritable bowel syndrome)    IBS (irritable bowel syndrome)     Past Surgical History:  Procedure Laterality Date   ESOPHAGOGASTRODUODENOSCOPY  SEP 2007 EPIG PAIN, DIARRHEA   MILD GASTRITIS/DUODENITIS, NO CELIAC SPRUE, HH,  SCHATZKI'S RING    There were no vitals filed for this visit.   Subjective Assessment - 10/27/21 1143     Subjective pt states he can already tell a little improvement.  Reports he is aware of all his weaknesses with the addition of the exercises.    Currently in Pain? Yes    Pain Score 5     Pain Location Back    Pain Orientation Mid;Lower    Pain Descriptors / Indicators Aching;Sore                               [facility] Adult PT Treatment/Exercise - 10/27/21 0001       Lumbar Exercises: Stretches   Single Knee to Chest Stretch Right;Left;3 reps;20 seconds    Other Lumbar Stretch Exercise LTR 10x 5 second holds bilateral    Other Lumbar Stretch Exercise doorway stretch for chest (instruction only..complete next  session)      Lumbar Exercises: Standing   Scapular Retraction 10 reps;Theraband    Theraband Level (Scapular Retraction) Level 2 (Red)    Row 10 reps;Theraband    Theraband Level (Row) Level 2 (Red)    Shoulder Extension 10 reps;Theraband    Theraband Level (Shoulder Extension) Level 2 (Red)      Lumbar Exercises: Supine   Ab Set 10 reps;5 seconds    Bridge 10 reps      Lumbar Exercises: Prone   Other Prone Lumbar Exercises prone lying-2 minutes and POE 1 minute X 2 interval with pillow under abdomen    Other Prone Lumbar Exercises heelsqueezes 10X3"                       PT Short Term Goals - 10/26/21 0932       PT SHORT TERM GOAL #1   Title PT to be I in HEP to have functional back motion    Time 3    Period Weeks    Status On-going    Target Date 11/11/21      PT SHORT  TERM GOAL #2   Title PT to be able to demonstrate proper body mechanics for lifting, pusing pulling  bed mobility to decrease stress off low back and allow pain level to be no greater than 6/10    Time 3    Period Weeks    Status On-going      PT SHORT TERM GOAL #3   Title Pt LE and core strength to be increase by 1/2 grade to allow pt to tolerate standing x 10 minutes without increased pain.    Period Weeks    Status On-going    Target Date 11/11/21               PT Long Term Goals - 10/26/21 0932       PT LONG TERM GOAL #1   Title Pt to be I in advanced HEP to improve posture so that pt does not exhibit a posterior lean when standing    Time 6    Period Weeks    Status On-going    Target Date 12/02/21      PT LONG TERM GOAL #2   Title Pt to state that he is able to complete a full day work without low back pain going above a 2/10    Time 6    Period Weeks    Status On-going      PT LONG TERM GOAL #3   Title Pt LE to increase one grade to be able to come sit to stand without increased back pain.    Time 6    Period Weeks    Status On-going      PT LONG TERM GOAL  #4   Title Pt core strength and knowledge of body mechanics to increase to allow pt to be able to work on his cars for an hour without increased back pain.    Time 6    Period Weeks    Status On-going                   Plan - 10/27/21 1229     Clinical Impression Statement Continued with established stretches and strengthening exercises.   Alternated prone lying and elbows without increased symptoms today with pillow under abdomen.  Began postural education with noted thoracic weakness and kyphosis.  Initiated strengthening using TheraBand with manual cues to reduce activation of levators.  Pt with noted weakness completing these exercises.  Demonstrated doorway stretch for chest mm to encourage upright posturing as well.  Pt will continue to benefit from education and instruction to reduce symptoms    Personal Factors and Comorbidities Time since onset of injury/illness/exacerbation    Examination-Activity Limitations Bend;Carry;Lift;Stand;Stairs;Squat;Sit    Examination-Participation Restrictions Cleaning;Other;Occupation    Stability/Clinical Decision Making Stable/Uncomplicated    Rehab Potential Good    PT Frequency 2x / week    PT Duration 6 weeks    PT Treatment/Interventions Therapeutic exercise;Patient/family education;Functional mobility training;Manual techniques    PT Next Visit Plan Work on posture,  as well as core strengthening,  educate in body mechanics for lifting, pushing pulling; pelvic tilts, possibly DN.  Next session complete doorway or corner stretch, seated hamstring stretching, squats and lunges.    PT Home Exercise Plan knee to chest,  prone on elbow with 2 pillows under abdomen, ab set, bridge 2/15 LTR             Patient will benefit from skilled therapeutic intervention in order to improve the following deficits and impairments:  Pain, Decreased strength, Difficulty walking, Postural dysfunction, Improper body mechanics, Decreased range of  motion  Visit Diagnosis: Chronic bilateral low back pain with bilateral sciatica  Muscle weakness (generalized)     Problem List Patient Active Problem List   Diagnosis Date Noted   SMOKER 07/05/2009   GERD 07/05/2009   IRRITABLE BOWEL SYNDROME 07/05/2009   Lurena Nida, PTA/CLT, WTA [phone]  Lurena Nida, PTA 10/27/2021, 12:32 PM  [facility] [address] [place], [place], [address] Phone: [phone]   Fax:  [phone]  Name: AMADI FRADY MRN: [id number] Date of Birth: 24-Sep-1965

## 2021-11-01 ENCOUNTER — Encounter (HOSPITAL_COMMUNITY): Payer: BC Managed Care – PPO | Admitting: Physical Therapy

## 2021-11-01 ENCOUNTER — Telehealth (HOSPITAL_COMMUNITY): Payer: Self-pay | Admitting: Physical Therapy

## 2021-11-01 NOTE — Telephone Encounter (Signed)
He had to leave work due to an upset stomach and he wil not be here today

## 2021-11-02 IMAGING — DX DG CHEST 2V
2 series · 2 of 2 positions shown · non-contrast
Comparison: 06/15/2014

CLINICAL DATA: Productive cough for 1 month.

EXAM:
CHEST - 2 VIEW

[chest pa]
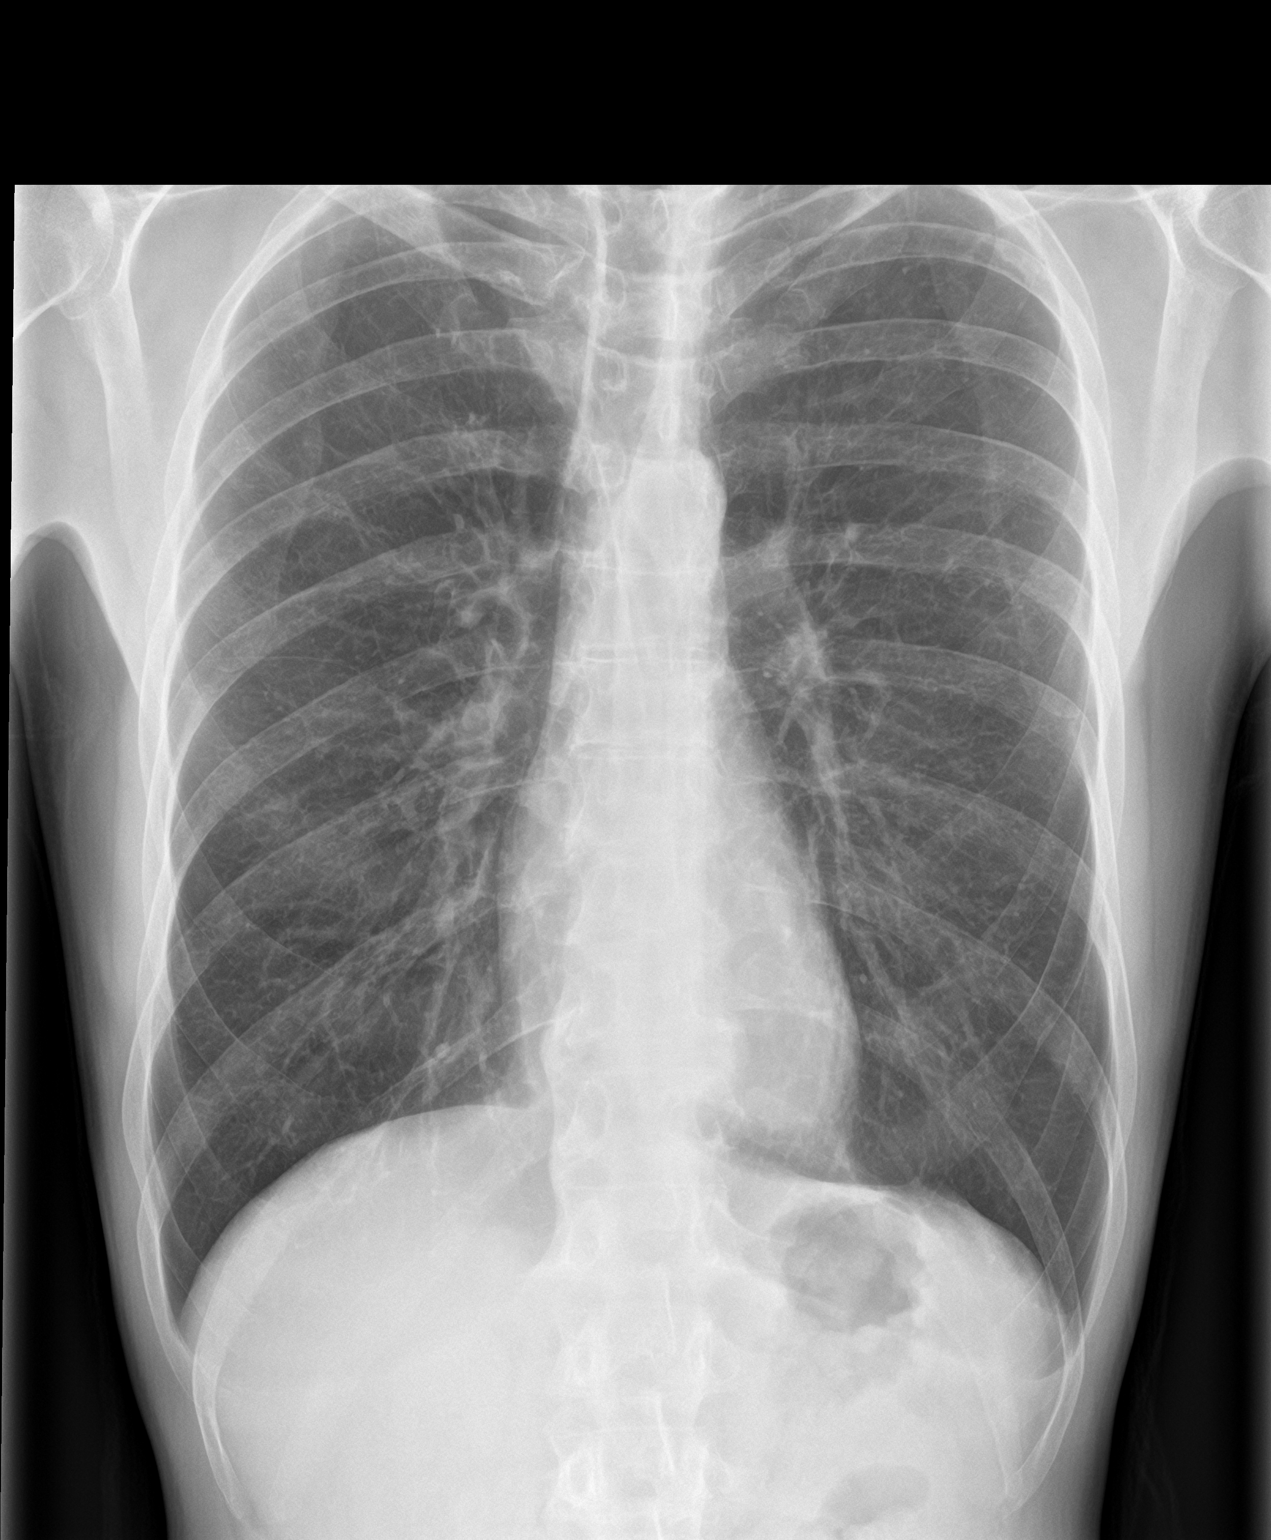

[chest lat]
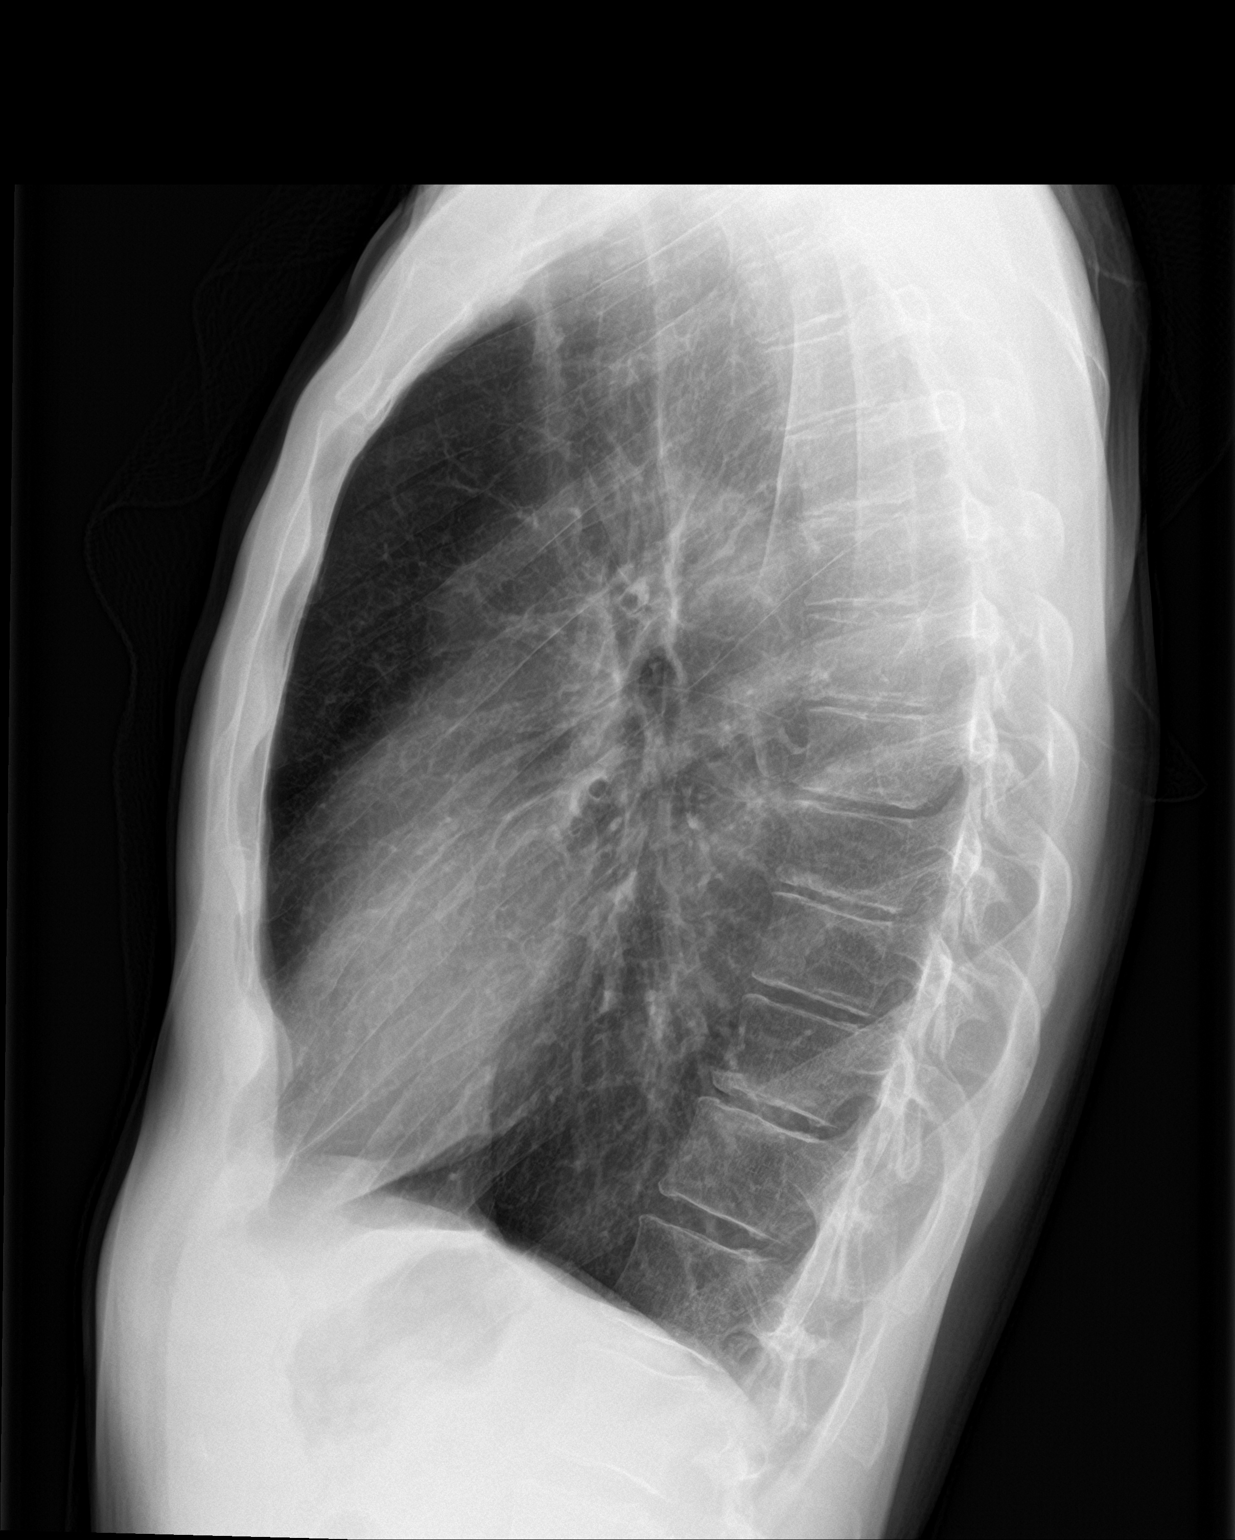

[2 of 2 positions shown; findings below may reference images not displayed]

FINDINGS: The heart size and mediastinal contours are within normal limits.
Pulmonary hyperinflation again seen. Both lungs are clear. The
visualized skeletal structures are unremarkable.
IMPRESSION: Stable pulmonary hyperinflation suspicious for COPD. No active
cardiopulmonary disease.

## 2021-11-04 ENCOUNTER — Encounter (HOSPITAL_COMMUNITY): Payer: Self-pay | Admitting: Physical Therapy

## 2021-11-04 ENCOUNTER — Ambulatory Visit (HOSPITAL_COMMUNITY): Payer: BC Managed Care – PPO | Admitting: Physical Therapy

## 2021-11-04 ENCOUNTER — Other Ambulatory Visit: Payer: Self-pay

## 2021-11-04 DIAGNOSIS — M5442 Lumbago with sciatica, left side: Secondary | ICD-10-CM | POA: Diagnosis not present

## 2021-11-04 DIAGNOSIS — G8929 Other chronic pain: Secondary | ICD-10-CM

## 2021-11-04 DIAGNOSIS — M6281 Muscle weakness (generalized): Secondary | ICD-10-CM

## 2021-11-04 DIAGNOSIS — M5441 Lumbago with sciatica, right side: Secondary | ICD-10-CM

## 2021-11-04 NOTE — Patient Instructions (Signed)
Access Code: [email or web address] URL: [email or web address] Date: 11/04/2021 Prepared by: Georges Lynch  Exercises Seated Hamstring Stretch with Strap - 2-3 x daily - 7 x weekly - 1 sets - 3 reps - 10-15 second hold Doorway Pec Stretch at 90 Degrees Abduction - 2-3 x daily - 7 x weekly - 1 sets - 5 reps - 10-15 second hold

## 2021-11-04 NOTE — Therapy (Signed)
[facility] [address] [place], [place], [address] Phone: [phone]   Fax:  [phone]  Physical Therapy Treatment  Patient Details  Name: [name] MRN: [id number] Date of Birth: 09-30-1965 Referring Provider (PT): Georgette Shell   Encounter Date: 11/04/2021   PT End of Session - 11/04/21 1123     Visit Number 4    Number of Visits 12    Date for PT Re-Evaluation 12/02/21    Authorization Type BCBS    Authorization - Visit Number 4    Authorization - Number of Visits 30    Progress Note Due on Visit 10    PT Start Time 1122    PT Stop Time 1200    PT Time Calculation (min) 38 min    Activity Tolerance Patient tolerated treatment well    Behavior During Therapy [facility] for tasks assessed/performed             Past Medical History:  Diagnosis Date   Body mass index (BMI) of 19 or less in adult 2007 117 LBS   GERD (gastroesophageal reflux disease)    IBS (irritable bowel syndrome)    IBS (irritable bowel syndrome)     Past Surgical History:  Procedure Laterality Date   ESOPHAGOGASTRODUODENOSCOPY  SEP 2007 EPIG PAIN, DIARRHEA   MILD GASTRITIS/DUODENITIS, NO CELIAC SPRUE, HH,  SCHATZKI'S RING    There were no vitals filed for this visit.   Subjective Assessment - 11/04/21 1124     Subjective Patient had a rough day at work earlier this week. He couldnt move for several days. He was sick later in the week and was unable to make his last visit.    Currently in Pain? Yes    Pain Score 4     Pain Location Back    Pain Orientation Lower;Posterior    Pain Descriptors / Indicators Aching    Pain Type Chronic pain                               [facility] Adult PT Treatment/Exercise - 11/04/21 0001       Lumbar Exercises: Stretches   Passive Hamstring Stretch Right;Left;3 reps;10 seconds    Passive Hamstring Stretch Limitations using strap    Other Lumbar Stretch Exercise doorway stretch for  chest 5 x10"      Lumbar Exercises: Standing   Row Theraband;20 reps   2 sets   Theraband Level (Row) Level 2 (Red)    Shoulder Extension Theraband;20 reps   2 sets   Theraband Level (Shoulder Extension) Level 2 (Red)    Other Standing Lumbar Exercises high rows RTB 2 x 10      Lumbar Exercises: Supine   Ab Set 10 reps;5 seconds    Glut Set 10 reps;2 seconds    Bridge 10 reps    Other Supine Lumbar Exercises abd bracing with ball squeeze, abd bracing with red Tband hip ABd x 10 reps each      Lumbar Exercises: Prone   Other Prone Lumbar Exercises prone lying-2 minutes and POE 1 minute X 2 interval with pillow under abdomen    Other Prone Lumbar Exercises heelsqueezes 10X3"                       PT Short Term Goals - 10/26/21 0932       PT SHORT TERM GOAL #1   Title  PT to be I in HEP to have functional back motion    Time 3    Period Weeks    Status On-going    Target Date 11/11/21      PT SHORT TERM GOAL #2   Title PT to be able to demonstrate proper body mechanics for lifting, pusing pulling  bed mobility to decrease stress off low back and allow pain level to be no greater than 6/10    Time 3    Period Weeks    Status On-going      PT SHORT TERM GOAL #3   Title Pt LE and core strength to be increase by 1/2 grade to allow pt to tolerate standing x 10 minutes without increased pain.    Period Weeks    Status On-going    Target Date 11/11/21               PT Long Term Goals - 10/26/21 0932       PT LONG TERM GOAL #1   Title Pt to be I in advanced HEP to improve posture so that pt does not exhibit a posterior lean when standing    Time 6    Period Weeks    Status On-going    Target Date 12/02/21      PT LONG TERM GOAL #2   Title Pt to state that he is able to complete a full day work without low back pain going above a 2/10    Time 6    Period Weeks    Status On-going      PT LONG TERM GOAL #3   Title Pt LE to increase one grade to be able  to come sit to stand without increased back pain.    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    Time 6    Period Weeks    Status On-going                   Plan - 11/04/21 1156     Clinical Impression Statement Patient continues with complaint of back pain and tightness.  He states compliance with HEP.  Patient continued with established exercises and this PT added doorwary stretch and hamstring stretch to improve functional mobility  and added high row with red Tband to increase postural strength. Patient reports no increased pain but continued low back tightness throughout treatment.  Patient will benefit from continued skilled PT interventions to address continued low back pain, poor posture, weakness of postural muscles, and decreased mobility    Personal Factors and Comorbidities Time since onset of injury/illness/exacerbation    Examination-Activity Limitations Bend;Carry;Lift;Stand;Stairs;Squat;Sit    Examination-Participation Restrictions Cleaning;Other;Occupation    Stability/Clinical Decision Making Stable/Uncomplicated    Rehab Potential Good    PT Frequency 2x / week    PT Duration 6 weeks    PT Treatment/Interventions Therapeutic exercise;Patient/family education;Functional mobility training;Manual techniques    PT Next Visit Plan continue to Work on posture,  as well as core strengthening,  educate in body mechanics for lifting, pushing pulling; pelvic tilts, possibly DN. Patient may benefit from traction    PT Home Exercise Plan knee to chest,  prone on elbow with 2 pillows  under abdomen, ab set, bridge 2/15 LTR 2/24 seated HS stretch, doorway stretch    Consulted and Agree with Plan of Care Patient             Patient will benefit from skilled therapeutic intervention in order to  improve the following deficits and impairments:  Pain, Decreased strength, Difficulty walking, Postural dysfunction, Improper body mechanics, Decreased range of motion  Visit Diagnosis: Chronic bilateral low back pain with bilateral sciatica  Muscle weakness (generalized)     Problem List Patient Active Problem List   Diagnosis Date Noted   SMOKER 07/05/2009   GERD 07/05/2009   IRRITABLE BOWEL SYNDROME 07/05/2009    12:11 PM, 11/04/21 Georges Lynch PT DPT  Physical Therapist with [place]  [facility]  [phone]  [facility] [address] [place], [place], [address] Phone: [phone]   Fax:  [phone]  Name: SAMUL MCINROY MRN: [id number] Date of Birth: [birth year]

## 2021-11-08 ENCOUNTER — Encounter (HOSPITAL_COMMUNITY): Payer: Self-pay

## 2021-11-08 ENCOUNTER — Other Ambulatory Visit: Payer: Self-pay

## 2021-11-08 ENCOUNTER — Ambulatory Visit (HOSPITAL_COMMUNITY): Payer: BC Managed Care – PPO

## 2021-11-08 DIAGNOSIS — M6281 Muscle weakness (generalized): Secondary | ICD-10-CM

## 2021-11-08 DIAGNOSIS — G8929 Other chronic pain: Secondary | ICD-10-CM

## 2021-11-08 DIAGNOSIS — M5442 Lumbago with sciatica, left side: Secondary | ICD-10-CM | POA: Diagnosis not present

## 2021-11-08 NOTE — Therapy (Signed)
[facility] [address] [place], [place], [address] Phone: [phone]   Fax:  [phone]  Physical Therapy Treatment  Patient Details  Name: [name] MRN: [id number] Date of Birth: May 18, 1966 Referring Provider (PT): Georgette Shell   Encounter Date: 11/08/2021   PT End of Session - 11/08/21 1001     Visit Number 5    Number of Visits 12    Date for PT Re-Evaluation 12/02/21    Authorization Type BCBS    Authorization - Visit Number 5    Authorization - Number of Visits 30    Progress Note Due on Visit 10    PT Start Time [phone]    PT Stop Time 1028    PT Time Calculation (min) 40 min    Activity Tolerance Patient tolerated treatment well    Behavior During Therapy [facility] for tasks assessed/performed             Past Medical History:  Diagnosis Date   Body mass index (BMI) of 19 or less in adult 2007 117 LBS   GERD (gastroesophageal reflux disease)    IBS (irritable bowel syndrome)    IBS (irritable bowel syndrome)     Past Surgical History:  Procedure Laterality Date   ESOPHAGOGASTRODUODENOSCOPY  SEP 2007 EPIG PAIN, DIARRHEA   MILD GASTRITIS/DUODENITIS, NO CELIAC SPRUE, HH,  SCHATZKI'S RING    There were no vitals filed for this visit.   Subjective Assessment - 11/08/21 0955     Subjective S: States he continues with LBP especially with work, after about 5 hours had spasms in his back, had to squat and stay, radiating and high pain. Had also had this pain after last session. Yesterday this happened then twice and feeling it become more often, bringing tears to eyes and miserable    Limitations Sitting;Lifting;Standing;Walking;House hold activities    Currently in Pain? Yes    Pain Score 4     Pain Location Back    Pain Orientation Lower;Posterior    Pain Descriptors / Indicators Aching;Tightness;Throbbing    Pain Type Chronic pain                               [facility] Adult PT  Treatment/Exercise - 11/08/21 1004       Lumbar Exercises: Stretches   Passive Hamstring Stretch Right;Left;3 reps;10 seconds    Passive Hamstring Stretch Limitations using strap    Single Knee to Chest Stretch Right;Left;3 reps;20 seconds    Double Knee to Chest Stretch 2 reps;30 seconds    Lower Trunk Rotation 5 reps;10 seconds   x 4 rounds   Pelvic Tilt 20 reps;5 seconds      Lumbar Exercises: Standing   Row Theraband;20 reps    Theraband Level (Row) Level 2 (Red)    Shoulder Extension Theraband;20 reps    Theraband Level (Shoulder Extension) Level 2 (Red)      Lumbar Exercises: Seated   Other Seated Lumbar Exercises big ball roll out lat stretch seated 10 x 10 second rocks                     PT Education - 11/08/21 0957     Education Details HEP, heat, dry needling; standing lumbar flexion use at home    Person(s) Educated Patient    Methods Explanation;Demonstration;Verbal cues    Comprehension Returned demonstration;Verbalized understanding  PT Short Term Goals - 10/26/21 0932       PT SHORT TERM GOAL #1   Title PT to be I in HEP to have functional back motion    Time 3    Period Weeks    Status On-going    Target Date 11/11/21      PT SHORT TERM GOAL #2   Title PT to be able to demonstrate proper body mechanics for lifting, pusing pulling  bed mobility to decrease stress off low back and allow pain level to be no greater than 6/10    Time 3    Period Weeks    Status On-going      PT SHORT TERM GOAL #3   Title Pt LE and core strength to be increase by 1/2 grade to allow pt to tolerate standing x 10 minutes without increased pain.    Period Weeks    Status On-going    Target Date 11/11/21               PT Long Term Goals - 10/26/21 0932       PT LONG TERM GOAL #1   Title Pt to be I in advanced HEP to improve posture so that pt does not exhibit a posterior lean when standing    Time 6    Period Weeks    Status On-going     Target Date 12/02/21      PT LONG TERM GOAL #2   Title Pt to state that he is able to complete a full day work without low back pain going above a 2/10    Time 6    Period Weeks    Status On-going      PT LONG TERM GOAL #3   Title Pt LE to increase one grade to be able to come sit to stand without increased back pain.    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    Time 6    Period Weeks    Status On-going                   Plan - 11/08/21 1002     Clinical Impression Statement A: Todays session continued to focus on patients lumbar pain and statements of tightness. Session focused on review of new HEP from last session with overall good form and minimal need for cueing. Education and discussion of use of heat prior to activity instead of ice, movement, and post activity stretching like hamstring stretch given post activity for muscle relaxation.  Patient demonstrates continued high tension in lumbar spine muscles that responded well to heat during supine exercises and was given education for continued use of heat and movement at home. Trial of seated lumbar flexion ball roll outs utilized today with some patient relief, possible add standing lumbar flexion next session as able.  Patient is a good candidate for continued skilled physical therapy for the above needs to progress toward goals and return to prior level of function.    Personal Factors and Comorbidities Time since onset of injury/illness/exacerbation    Examination-Activity Limitations Bend;Carry;Lift;Stand;Stairs;Squat;Sit    Examination-Participation Restrictions Cleaning;Other;Occupation    Stability/Clinical Decision Making Stable/Uncomplicated    Rehab Potential Good  PT Frequency 2x / week    PT Duration 6 weeks    PT  Treatment/Interventions Therapeutic exercise;Patient/family education;Functional mobility training;Manual techniques    PT Next Visit Plan continue to Work on posture,  as well as core strengthening,  educate in body mechanics for lifting, pushing pulling; pelvic tilts, possibly DN. Patient may benefit from traction post heat/movement    PT Home Exercise Plan knee to chest,  prone on elbow with 2 pillows under abdomen, ab set, bridge 2/15 LTR 2/24 seated HS stretch, doorway stretch; all used after heat    Consulted and Agree with Plan of Care Patient             Patient will benefit from skilled therapeutic intervention in order to improve the following deficits and impairments:  Pain, Decreased strength, Difficulty walking, Postural dysfunction, Improper body mechanics, Decreased range of motion  Visit Diagnosis: Chronic bilateral low back pain with bilateral sciatica  Muscle weakness (generalized)     Problem List Patient Active Problem List   Diagnosis Date Noted   SMOKER 07/05/2009   GERD 07/05/2009   IRRITABLE BOWEL SYNDROME 07/05/2009     10:37 AM, 11/08/21  Harvie Bridge. Chestine Spore PT, DPT  Contract Physical Therapist at  [facility] [phone]   [facility] [address] [place], [place], [address] Phone: [phone]   Fax:  [phone]  Name: [name] MRN: [id number] Date of Birth: 10/06/1965

## 2021-11-08 NOTE — Patient Instructions (Signed)
Access Code: [email or web address] URL: [email or web address] Date: 11/08/2021 Prepared by: Jerilynn Som  Patient Education Heat

## 2021-11-10 ENCOUNTER — Other Ambulatory Visit: Payer: Self-pay

## 2021-11-10 ENCOUNTER — Ambulatory Visit (HOSPITAL_COMMUNITY): Payer: BC Managed Care – PPO | Attending: Physician Assistant

## 2021-11-10 ENCOUNTER — Encounter (HOSPITAL_COMMUNITY): Payer: Self-pay

## 2021-11-10 DIAGNOSIS — M5442 Lumbago with sciatica, left side: Secondary | ICD-10-CM | POA: Insufficient documentation

## 2021-11-10 DIAGNOSIS — G8929 Other chronic pain: Secondary | ICD-10-CM | POA: Diagnosis present

## 2021-11-10 DIAGNOSIS — M6281 Muscle weakness (generalized): Secondary | ICD-10-CM | POA: Insufficient documentation

## 2021-11-10 DIAGNOSIS — M5441 Lumbago with sciatica, right side: Secondary | ICD-10-CM | POA: Insufficient documentation

## 2021-11-10 DIAGNOSIS — M5416 Radiculopathy, lumbar region: Secondary | ICD-10-CM | POA: Insufficient documentation

## 2021-11-10 NOTE — Therapy (Signed)
[place] [facility] ?[address] ?[place], [place], [address] ?Phone: [phone]   Fax:  [phone] ? ?Physical Therapy Treatment ? ?Patient Details  ?Name: [name] ?MRN: [id number] ?Date of Birth: October 01, 1965 ?Referring Provider (PT): Roe Coombs ? ? ?Encounter Date: 11/10/2021 ? ? PT End of Session - 11/10/21 [id number]   ? ? Visit Number 6   ? Number of Visits 12   ? Date for PT Re-Evaluation 12/02/21   ? Authorization Type BCBS   ? Authorization - Visit Number 6   ? Authorization - Number of Visits 30   ? Progress Note Due on Visit 10   ? PT Start Time [id number]   ? PT Stop Time 1012   ? PT Time Calculation (min) 40 min   ? Activity Tolerance Patient tolerated treatment well   ? Behavior During Therapy [facility] for tasks assessed/performed   ? ?  ?  ? ?  ? ? ?Past Medical History:  ?Diagnosis Date  ? Body mass index (BMI) of 19 or less in adult 2007 117 LBS  ? GERD (gastroesophageal reflux disease)   ? IBS (irritable bowel syndrome)   ? IBS (irritable bowel syndrome)   ? ? ?Past Surgical History:  ?Procedure Laterality Date  ? ESOPHAGOGASTRODUODENOSCOPY  SEP 2007 EPIG PAIN, DIARRHEA  ? MILD GASTRITIS/DUODENITIS, NO CELIAC SPRUE, HH,  SCHATZKI'S RING  ? ? ?There were no vitals filed for this visit. ? ? Subjective Assessment - 11/10/21 0930   ? ? Subjective Felt better after leaving last session. Has been trying to do rice heat at home and HEP but it is hard especially when he comes home from work really hurting. Had a back spasm yesterday at work by lunch and performed doorway stretch and leaning forward bending his back and that brought his pain level back down so he could complete the work day.   ? Limitations Sitting;Lifting;Standing;Walking;House hold activities   ? Currently in Pain? Yes   ? Pain Score 5    ? Pain Location Back   ? Pain Orientation Medial;Posterior   ? Pain Descriptors / Indicators Aching;Dull   ? ?  ?  ? ?  ? ? ? ? [facility] Adult PT Treatment/Exercise - 11/10/21  0001   ? ?  ? Lumbar Exercises: Stretches  ? Passive Hamstring Stretch Right;Left;3 reps;10 seconds   ? Passive Hamstring Stretch Limitations using strap   ? Single Knee to Chest Stretch Right;Left;3 reps;20 seconds   ? Double Knee to Chest Stretch 2 reps;30 seconds   ? Lower Trunk Rotation 5 reps;10 seconds   x 4 rounds  ? Pelvic Tilt 20 reps;5 seconds   ? Other Lumbar Stretch Exercise --   ?  ? Lumbar Exercises: Seated  ? Other Seated Lumbar Exercises big ball roll out lat stretch seated 10 x 10 second rocks   ? Other Seated Lumbar Exercises --   ? ?  ?  ? ?  ? ? ? ? ? PT Short Term Goals - 11/10/21 1220   ? ?  ? PT SHORT TERM GOAL #1  ? Title PT to be I in HEP to have functional back motion   ? Time 3   ? Period Weeks   ? Status On-going   ? Target Date 11/11/21   ?  ? PT SHORT TERM GOAL #2  ? Title PT to be able to demonstrate proper body mechanics for lifting, pusing pulling  bed mobility to decrease stress off  low back and allow pain level to be no greater than 6/10   ? Time 3   ? Period Weeks   ? Status On-going   ?  ? PT SHORT TERM GOAL #3  ? Title Pt LE and core strength to be increase by 1/2 grade to allow pt to tolerate standing x 10 minutes without increased pain.   ? Period Weeks   ? Status On-going   ? Target Date 11/11/21   ? ?  ?  ? ?  ? ? ? ? PT Long Term Goals - 11/10/21 1220   ? ?  ? PT LONG TERM GOAL #1  ? Title Pt to be I in advanced HEP to improve posture so that pt does not exhibit a posterior lean when standing   ? Time 6   ? Period Weeks   ? Status On-going   ? Target Date 12/02/21   ?  ? PT LONG TERM GOAL #2  ? Title Pt to state that he is able to complete a full day work without low back pain going above a 2/10   ? Time 6   ? Period Weeks   ? Status On-going   ?  ? PT LONG TERM GOAL #3  ? Title Pt LE to increase one grade to be able to come sit to stand without increased back pain.   ? Time 6   ? Period Weeks   ? Status On-going   ?  ? PT LONG TERM GOAL #4  ? 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   ? Time 6   ? Period Weeks   ? Status On-going   ? ?  ?  ? ?  ? ? ? ? ? ? ? ? Plan - 11/10/21 0943   ? ? Clinical Impression Statement Session focused on reviewing program from last session and educating on importance of completing heat and HEP stretches to reduce pain prior to going to bed. Patient's back muscles continue to be tight. Patient reported his low back felt stiffer by the end of session. Patient would continue to benefit from skilled physical therapy to reduce impairment and improve function.   ? Personal Factors and Comorbidities Time since onset of injury/illness/exacerbation   ? Examination-Activity Limitations Bend;Carry;Lift;Stand;Stairs;Squat;Sit   ? Examination-Participation Restrictions Cleaning;Other;Occupation   ? Stability/Clinical Decision Making Stable/Uncomplicated   ? Rehab Potential Good   ? PT Frequency 2x / week   ? PT Duration 6 weeks   ? PT Treatment/Interventions Therapeutic exercise;Patient/family education;Functional mobility training;Manual techniques   ? PT Next Visit Plan continue to Work on posture,  as well as core strengthening,  educate in body mechanics for lifting, pushing pulling; pelvic tilts, possibly DN. Patient may benefit from traction post heat/movement   ? PT Home Exercise Plan knee to chest,  prone on elbow with 2 pillows under abdomen, ab set, bridge 2/15 LTR 2/24 seated HS stretch, doorway stretch; all used after heat   ? Consulted and Agree with Plan of Care Patient   ? ?  ?  ? ?  ? ? ?Patient will benefit from skilled therapeutic intervention in order to improve the following deficits and impairments:  Pain, Decreased strength, Difficulty walking, Postural dysfunction, Improper body mechanics, Decreased range of motion ? ?Visit Diagnosis: ?Chronic bilateral low back pain with bilateral sciatica ? ?Muscle weakness  (  generalized) ? ? ? ? ?Problem List ?Patient Active Problem List  ? Diagnosis Date Noted  ? [name] 07/05/2009  ? GERD 07/05/2009  ? IRRITABLE BOWEL SYNDROME 07/05/2009  ? ?Floria Raveling. Hartnett-Rands, MS, PT ?Per [place] ?[place] [id number] ? ?Marimar Suber  Hartnett-Rands, PT ?11/10/2021, 12:21 PM ? ?[place] [facility] ?[address] ?[place], [place], [address] ?Phone: [phone]   Fax:  [phone] ? ?Name: [name] ?MRN: [id number] ?Date of Birth: June 03, 1966 ? ? ? ?

## 2021-11-15 ENCOUNTER — Ambulatory Visit (HOSPITAL_COMMUNITY): Payer: BC Managed Care – PPO

## 2021-11-15 ENCOUNTER — Other Ambulatory Visit: Payer: Self-pay

## 2021-11-15 ENCOUNTER — Encounter (HOSPITAL_COMMUNITY): Payer: Self-pay

## 2021-11-15 DIAGNOSIS — M5441 Lumbago with sciatica, right side: Secondary | ICD-10-CM

## 2021-11-15 DIAGNOSIS — G8929 Other chronic pain: Secondary | ICD-10-CM

## 2021-11-15 DIAGNOSIS — M6281 Muscle weakness (generalized): Secondary | ICD-10-CM

## 2021-11-15 DIAGNOSIS — M5442 Lumbago with sciatica, left side: Secondary | ICD-10-CM | POA: Diagnosis not present

## 2021-11-15 NOTE — Therapy (Signed)
[place] [facility] ?[address] ?[place], [place], [address] ?Phone: [phone]   Fax:  [phone] ? ?Physical Therapy Treatment ? ?Patient Details  ?Name: [name] ?MRN: [id number] ?Date of Birth: [birth year] ?Referring Provider (PT): Georgette Shell ? ? ?Encounter Date: 11/15/2021 ? ? PT End of Session - 11/15/21 0945   ? ? Visit Number 7   ? Number of Visits 12   ? Date for PT Re-Evaluation 12/02/21   ? Authorization - Visit Number 7   ? Authorization - Number of Visits 30   ? Progress Note Due on Visit 10   ? PT Start Time [phone]   ? PT Stop Time [phone]   ? PT Time Calculation (min) 40 min   ? Activity Tolerance Patient tolerated treatment well   ? Behavior During Therapy [facility] for tasks assessed/performed   ? ?  ?  ? ?  ? ? ?Past Medical History:  ?Diagnosis Date  ? Body mass index (BMI) of 19 or less in adult 2007 117 LBS  ? GERD (gastroesophageal reflux disease)   ? IBS (irritable bowel syndrome)   ? IBS (irritable bowel syndrome)   ? ? ?Past Surgical History:  ?Procedure Laterality Date  ? ESOPHAGOGASTRODUODENOSCOPY  SEP 2007 EPIG PAIN, DIARRHEA  ? MILD GASTRITIS/DUODENITIS, NO CELIAC SPRUE, HH,  SCHATZKI'S RING  ? ? ?There were no vitals filed for this visit. ? ? Subjective Assessment - 11/15/21 0924   ? ? Subjective Pt stated increased pain this weekend following yard work, pain scale 7/10.  Pt stated he feels a lot pressure and overall tightness in lower back today.   ? Patient Stated Goals less pain   ? Currently in Pain? Yes   ? Pain Score 7    ? Pain Location Back   ? Pain Descriptors / Indicators Pressure   ? Pain Type Chronic pain   ? Pain Onset More than a month ago   ? Pain Frequency Constant   ? ?  ?  ? ?  ? ? ? ? ? ? ? ? ? ? ? ? ? ? ? ? ? ? ? ? [facility] Adult PT Treatment/Exercise - 11/15/21 0001   ? ?  ? Lumbar Exercises: Stretches  ? Passive Hamstring Stretch Right;Left;3 reps;10 seconds   ? Passive Hamstring Stretch Limitations using strap   ? Single Knee to  Chest Stretch Right;Left;2 reps   ?  ? Lumbar Exercises: Standing  ? Other Standing Lumbar Exercises 3D hip excursion   ?  ? Lumbar Exercises: Seated  ? Other Seated Lumbar Exercises big ball roll out lat stretch seated 10 x 10 second rocks   ?  ? Lumbar Exercises: Quadruped  ? Madcat/Old Horse 5 reps   ?  ? Modalities  ? Modalities Traction   ?  ? Traction  ? Type of Traction Lumbar   ? Max (lbs) 50   ? Hold Time static   ? Time 10   ? ?  ?  ? ?  ? ? ? ? ? ? ? ? ? ? ? ? PT Short Term Goals - 11/10/21 1220   ? ?  ? PT SHORT TERM GOAL #1  ? Title PT to be I in HEP to have functional back motion   ? Time 3   ? Period Weeks   ? Status On-going   ? Target Date 11/11/21   ?  ? PT SHORT TERM GOAL #2  ? Title PT  to be able to demonstrate proper body mechanics for lifting, pusing pulling  bed mobility to decrease stress off low back and allow pain level to be no greater than 6/10   ? Time 3   ? Period Weeks   ? Status On-going   ?  ? PT SHORT TERM GOAL #3  ? Title Pt LE and core strength to be increase by 1/2 grade to allow pt to tolerate standing x 10 minutes without increased pain.   ? Period Weeks   ? Status On-going   ? Target Date 11/11/21   ? ?  ?  ? ?  ? ? ? ? PT Long Term Goals - 11/10/21 1220   ? ?  ? PT LONG TERM GOAL #1  ? Title Pt to be I in advanced HEP to improve posture so that pt does not exhibit a posterior lean when standing   ? Time 6   ? Period Weeks   ? Status On-going   ? Target Date 12/02/21   ?  ? PT LONG TERM GOAL #2  ? Title Pt to state that he is able to complete a full day work without low back pain going above a 2/10   ? Time 6   ? Period Weeks   ? Status On-going   ?  ? PT LONG TERM GOAL #3  ? Title Pt LE to increase one grade to be able to come sit to stand without increased back pain.   ? Time 6   ? Period Weeks   ? Status On-going   ?  ? PT LONG TERM GOAL #4  ? Title Pt core strength and knowledge of body mechanics to increase to allow pt to be able to work on his cars for an hour without  increased back pain.   ? Time 6   ? Period Weeks   ? Status On-going   ? ?  ?  ? ?  ? ? ? ? ? ? ? ? Plan - 11/15/21 0947   ? ? Clinical Impression Statement Pt limited by pain with all movements.  Reports he has been compliant with current HEP paired with heat at home.  Pt c/o pressure and tightness.  Therex focus wiht mobility.  Trial with mechanical traction with reports of minimal improvements.   ? Personal Factors and Comorbidities Time since onset of injury/illness/exacerbation   ? Examination-Activity Limitations Bend;Carry;Lift;Stand;Stairs;Squat;Sit   ? Examination-Participation Restrictions Cleaning;Other;Occupation   ? Stability/Clinical Decision Making Stable/Uncomplicated   ? Clinical Decision Making Moderate   ? Rehab Potential Good   ? PT Frequency 2x / week   ? PT Duration 6 weeks   ? PT Treatment/Interventions Therapeutic exercise;Patient/family education;Functional mobility training;Manual techniques   ? PT Next Visit Plan F/U with mechanics traction.  continue to Work on posture,  as well as core strengthening,  educate in body mechanics for lifting, pushing pulling; pelvic tilts, possibly DN. Patient may benefit from traction post heat/movement   ? PT Home Exercise Plan knee to chest,  prone on elbow with 2 pillows under abdomen, ab set, bridge 2/15 LTR 2/24 seated HS stretch, doorway stretch; all used after heat   ? Consulted and Agree with Plan of Care Patient   ? ?  ?  ? ?  ? ? ?Patient will benefit from skilled therapeutic intervention in order to improve the following deficits and impairments:  Pain, Decreased strength, Difficulty walking, Postural dysfunction, Improper body mechanics, Decreased range of motion ? ?  Visit Diagnosis: ?Chronic bilateral low back pain with bilateral sciatica ? ?Muscle weakness (generalized) ? ? ? ? ?Problem List ?Patient Active Problem List  ? Diagnosis Date Noted  ? SMOKER 07/05/2009  ? GERD 07/05/2009  ? IRRITABLE BOWEL SYNDROME 07/05/2009  ? ?Becky Sax,  LPTA/CLT; CBIS ?[phone] ? ?Juel Burrow, PTA ?11/15/2021, 1:02 PM ? ?[place] ?[facility] ?[address] ?[place], [place], [address] ?Phone: [phone]   Fax:  [phone] ? ?Name: [name] ?MRN: [id number] ?Date of Birth: 1966/05/04 ? ? ? ?

## 2021-11-17 ENCOUNTER — Encounter (HOSPITAL_COMMUNITY): Payer: Self-pay | Admitting: Physical Therapy

## 2021-11-17 ENCOUNTER — Ambulatory Visit (HOSPITAL_COMMUNITY): Payer: BC Managed Care – PPO | Admitting: Physical Therapy

## 2021-11-17 ENCOUNTER — Other Ambulatory Visit: Payer: Self-pay

## 2021-11-17 DIAGNOSIS — M5442 Lumbago with sciatica, left side: Secondary | ICD-10-CM | POA: Diagnosis not present

## 2021-11-17 DIAGNOSIS — M5416 Radiculopathy, lumbar region: Secondary | ICD-10-CM

## 2021-11-17 NOTE — Therapy (Signed)
?OUTPATIENT PHYSICAL THERAPY TREATMENT NOTE ? ? ?Patient Name: [name] ?MRN: [id number] ?DOB:07/21/1966, 56 y.o., male ?Today's Date: 11/17/2021 ? ?PCP: Assunta FoundGolding, John, MD ?REFERRING PROVIDER: Assunta FoundGolding, John, MD ? ? PT End of Session - 11/17/21 1010   ? ? Visit Number 8   ? Number of Visits 12   ? Date for PT Re-Evaluation 12/02/21   ? Authorization Type BCBS   ? Authorization - Visit Number 8   ? Authorization - Number of Visits 30   ? Progress Note Due on Visit 10   ? PT Start Time 1010   ? PT Stop Time 1050   ? PT Time Calculation (min) 40 min   ? Activity Tolerance Patient tolerated treatment well   ? [facility] assessed/performed   ? ?  ?  ? ?  ? ? ?Past Medical History:  ?Diagnosis Date  ? Body mass index (BMI) of 19 or less in adult 2007 117 LBS  ? GERD (gastroesophageal reflux disease)   ? IBS (irritable bowel syndrome)   ? IBS (irritable bowel syndrome)   ? ?Past Surgical History:  ?Procedure Laterality Date  ? ESOPHAGOGASTRODUODENOSCOPY  SEP 2007 EPIG PAIN, DIARRHEA  ? MILD GASTRITIS/DUODENITIS, NO CELIAC SPRUE, HH,  SCHATZKI'S RING  ? ?Patient Active Problem List  ? Diagnosis Date Noted  ? SMOKER 07/05/2009  ? GERD 07/05/2009  ? IRRITABLE BOWEL SYNDROME 07/05/2009  ? ? ?REFERRING DIAG: Lumbar radiculopathy ? ?THERAPY DIAG:  Lumbar radiculopathy  ? ? ?PERTINENT HISTORY:  Chronic LBP  ?PRECAUTIONS: none ? ?SUBJECTIVE: Pt states that he has had low back pain for years but in August he was moving out of his home, moving a recliner which exacerbated his pain.  He has had CT scans and the two lower disc had some compression.  He states that his tailbone hurts when he sits on it so he has to shift to the right or left.  He states that the pain is mainly in the lower back but will occasionally radiate to his buttock B. PT states that he is hurting bad today.   ? ?PAIN:  ?Are you having pain? Yes ?NPRS scale: 8/10 ?Pain location: Low back  ?Pain orientation: Right and Lower  ?PAIN  TYPE: aching, throbbing, and tight ?Pain description: constant and aching  ?Aggravating factors: standing  or being still in any spot ?Relieving factors: heat  ? ? ? ?TODAY'S TREATMENT:  ?Prone:  deep breaths to relax ?             Glut sets x 10 ?             Heel sets x 10 ?              POE 1 minute x 3 with deep breaths ?Supine:  Ab sets with legs on bolster x10 ?               B UE flexion to 90 with stability x5 ?               Knee to chest x 3 for 30" ?               Hamstring stretch B 3 x 30"  ?               Trunk rotation  ?               Scapular and cervical retraction x  10  ? ?PATIENT EDUCATION: ?Education details: not to go to pain just to stretch ?Person educated: Patient ?Education method: Explanation ?Education comprehension: verbalized understanding ? ? ?HOME EXERCISE PROGRAM: ?knee to chest,  prone on elbow with 2 pillows under abdomen, ab set, bridge 2/15 LTR 2/24 seated HS stretch, doorway stretch; all used after heat  ? ? PT Short Term Goals - 11/17/21 1023   ? ?  ? PT SHORT TERM GOAL #1  ? Title PT to be I in HEP to have functional back motion   ? Time 3   ? Period Weeks   ? Status On-going   ? Target Date 11/11/21   ?  ? PT SHORT TERM GOAL #2  ? Title PT to be able to demonstrate proper body mechanics for lifting, pusing pulling  bed mobility to decrease stress off low back and allow pain level to be no greater than 6/10   ? Time 3   ? Period Weeks   ? Status On-going   ?  ? PT SHORT TERM GOAL #3  ? Title Pt LE and core strength to be increase by 1/2 grade to allow pt to tolerate standing x 10 minutes without increased pain.   ? Period Weeks   ? Status On-going   ? Target Date 11/11/21   ? ?  ?  ? ?  ? ? ? PT Long Term Goals - 11/17/21 1023   ? ?  ? PT LONG TERM GOAL #1  ? Title Pt to be I in advanced HEP to improve posture so that pt does not exhibit a posterior lean when standing   ? Time 6   ? Period Weeks   ? Status On-going   ? Target Date 12/02/21   ?  ? PT LONG TERM  GOAL #2  ? Title Pt to state that he is able to complete a full day work without low back pain going above a 2/10   ? Time 6   ? Period Weeks   ? Status On-going   ?  ? PT LONG TERM GOAL #3  ? Title Pt LE to increase one grade to be able to come sit to stand without increased back pain.   ? Time 6   ? Period Weeks   ? Status On-going   ?  ? PT LONG TERM GOAL #4  ? Title Pt core strength and knowledge of body mechanics to increase to allow pt to be able to work on his cars for an hour without increased back pain.   ? Time 6   ? Period Weeks   ? Status On-going   ? ?  ?  ? ?  ? ? ? Plan - 11/17/21 1010   ? ? Clinical Impression Statement Pt continues to be limited by pain.  Today treatment focused on mobility and decreasing pain.   ? Personal Factors and Comorbidities Time since onset of injury/illness/exacerbation   ? Examination-Activity Limitations Bend;Carry;Lift;Stand;Stairs;Squat;Sit   ? Examination-Participation Restrictions Cleaning;Other;Occupation   ? Stability/Clinical Decision Making Stable/Uncomplicated   ? Rehab Potential Good   ? PT Frequency 2x / week   ? PT Duration 6 weeks   ? PT Treatment/Interventions Therapeutic exercise;Patient/family education;Functional mobility training;Manual techniques   ? PT Next Visit Plan .  continue to Work on posture,  as well as core strengthening,  educate in body mechanics for lifting, pushing pulling; pelvic tilts,   ? PT Home Exercise Plan knee to chest,  prone on  elbow with 2 pillows under abdomen, ab set, bridge 2/15 LTR 2/24 seated HS stretch, doorway stretch; all used after heat   ? Consulted and Agree with Plan of Care Patient   ? ?  ?  ? ?  ? ? ? ?Virgina Organ, PT CLT ?[phone]  ?11/17/2021, 10:27 AM ? ?   ?

## 2021-11-22 ENCOUNTER — Other Ambulatory Visit: Payer: Self-pay

## 2021-11-22 ENCOUNTER — Encounter (HOSPITAL_COMMUNITY): Payer: Self-pay | Admitting: Physical Therapy

## 2021-11-22 ENCOUNTER — Ambulatory Visit (HOSPITAL_COMMUNITY): Payer: BC Managed Care – PPO | Admitting: Physical Therapy

## 2021-11-22 DIAGNOSIS — M6281 Muscle weakness (generalized): Secondary | ICD-10-CM

## 2021-11-22 DIAGNOSIS — M5442 Lumbago with sciatica, left side: Secondary | ICD-10-CM | POA: Diagnosis not present

## 2021-11-22 DIAGNOSIS — G8929 Other chronic pain: Secondary | ICD-10-CM

## 2021-11-22 NOTE — Patient Instructions (Signed)
Access Code: [email or web address] ?URL: [email or web address] ?Date: 11/22/2021 ?Prepared by: Greig Castilla Zarion Oliff ? ?Exercises ?TL Sidebending Stretch - Single Arm Overhead - 2 x daily - 7 x weekly - 10 reps - 5 second hold ? ?

## 2021-11-22 NOTE — Therapy (Signed)
?OUTPATIENT PHYSICAL THERAPY TREATMENT NOTE ? ? ?Patient Name: [name] ?MRN: [id number] ?DOB:February 26, 1966, 56 y.o., male ?Today's Date: 11/22/2021 ? ?PCP: Assunta Found, MD ?REFERRING PROVIDER: Georgette Shell ? ? PT End of Session - 11/22/21 4193   ? ? Visit Number 9   ? Number of Visits 12   ? Date for PT Re-Evaluation 12/02/21   ? Authorization Type BCBS   ? Authorization - Visit Number 9   ? Authorization - Number of Visits 30   ? Progress Note Due on Visit 10   ? PT Start Time [phone]   ? PT Stop Time 1000   ? PT Time Calculation (min) 38 min   ? Activity Tolerance Patient tolerated treatment well   ? Behavior During Therapy [facility] for tasks assessed/performed   ? ?  ?  ? ?  ? ? ?Past Medical History:  ?Diagnosis Date  ? Body mass index (BMI) of 19 or less in adult 2007 117 LBS  ? GERD (gastroesophageal reflux disease)   ? IBS (irritable bowel syndrome)   ? IBS (irritable bowel syndrome)   ? ?Past Surgical History:  ?Procedure Laterality Date  ? ESOPHAGOGASTRODUODENOSCOPY  SEP 2007 EPIG PAIN, DIARRHEA  ? MILD GASTRITIS/DUODENITIS, NO CELIAC SPRUE, HH,  SCHATZKI'S RING  ? ?Patient Active Problem List  ? Diagnosis Date Noted  ? SMOKER 07/05/2009  ? GERD 07/05/2009  ? IRRITABLE BOWEL SYNDROME 07/05/2009  ? ? ?REFERRING DIAG: Back pain ? ?THERAPY DIAG:  ?Chronic bilateral low back pain with bilateral sciatica ? ?Muscle weakness (generalized) ? ?PERTINENT HISTORY: n/a ? ?PRECAUTIONS: None ? ?SUBJECTIVE: Patient states last Thursday his back was really bothering him. He went to family MD Thursday afternoon. Got steroid shot in buttock and back pain decreased a lot when he got up Friday. Has appointment tomorrow with spinal specialist.  ? ?PAIN:  ?Are you having pain? Yes: NPRS scale: 5/10 ?Pain location: low back  ?Pain description: aching, throbbing, tight ?Aggravating factors: standing/walking ?Relieving factors: steroid shot, heat ? ?Objective ?Palpation: patient TTP to R lumbar paraspinals and QL ? ? ? ?TODAY'S  TREATMENT:  ?11/22/21 ?Trunk flexion stretch 3 way with red ball 10 second holds x 5 each direction ?DKTC with heels on red ball 10x 5 second holds ?IASTM with percussion gun level 10 to lumbar paraspinals and QL in prone ?Overhead QL stretch 5 x 10 second holds for R ?Contract relax for R QL in sidelying 8x 5 second holds ? ? ?11/17/21 ?Prone:  deep breaths to relax ?             Glut sets x 10 ?             Heel sets x 10 ?              POE 1 minute x 3 with deep breaths ?Supine:  Ab sets with legs on bolster x10 ?               B UE flexion to 90 with stability x5 ?               Knee to chest x 3 for 30" ?               Hamstring stretch B 3 x 30"  ?               Trunk rotation  ?               Scapular and cervical retraction  x  10  ? ? ?PATIENT EDUCATION: ?Education details: HEP ?Person educated: Patient ?Education method: Explanation and Demonstration ?Education comprehension: verbalized understanding and returned demonstration ? ? ?HOME EXERCISE PROGRAM: ?knee to chest,  prone on elbow with 2 pillows under abdomen, ab set, bridge 2/15 LTR 2/24 seated HS stretch, doorway stretch; all used after heat  ? ? PT Short Term Goals   ? ?  ? PT SHORT TERM GOAL #1  ? Title PT to be I in HEP to have functional back motion   ? Time 3   ? Period Weeks   ? Status On-going   ? Target Date 11/11/21   ?  ? PT SHORT TERM GOAL #2  ? Title PT to be able to demonstrate proper body mechanics for lifting, pusing pulling  bed mobility to decrease stress off low back and allow pain level to be no greater than 6/10   ? Time 3   ? Period Weeks   ? Status On-going   ?  ? PT SHORT TERM GOAL #3  ? Title Pt LE and core strength to be increase by 1/2 grade to allow pt to tolerate standing x 10 minutes without increased pain.   ? Period Weeks   ? Status On-going   ? Target Date 11/11/21   ? ?  ?  ? ?  ? ? ? PT Long Term Goals   ? ?  ? PT LONG TERM GOAL #1  ? Title Pt to be I in advanced HEP to improve posture so that pt does not exhibit a  posterior lean when standing   ? Time 6   ? Period Weeks   ? Status On-going   ? Target Date 12/02/21   ?  ? PT LONG TERM GOAL #2  ? Title Pt to state that he is able to complete a full day work without low back pain going above a 2/10   ? Time 6   ? Period Weeks   ? Status On-going   ?  ? PT LONG TERM GOAL #3  ? Title Pt LE to increase one grade to be able to come sit to stand without increased back pain.   ? Time 6   ? Period Weeks   ? Status On-going   ?  ? PT LONG TERM GOAL #4  ? Title Pt core strength and knowledge of body mechanics to increase to allow pt to be able to work on his cars for an hour without increased back pain.   ? Time 6   ? Period Weeks   ? Status On-going   ? ?  ?  ? ?  ? ? ? Plan   ? ? Clinical Impression Statement Patient overall stating improvement in symptoms with previously completed mobility exercises. Continued with mobility and core strengthening which is tolerated well. Patient with tender and hyperactive R lumbar paraspinals/QL. Began IASTM with decrease in symptoms following and palpable decrease in tissue resistance. Patient stating improvement in symptoms following contract relax for R QL. Patient will continue to benefit from physical therapy to reduce impairment and improve function.   ? Personal Factors and Comorbidities Time since onset of injury/illness/exacerbation   ? Examination-Activity Limitations Bend;Carry;Lift;Stand;Stairs;Squat;Sit   ? Examination-Participation Restrictions Cleaning;Other;Occupation   ? Stability/Clinical Decision Making Stable/Uncomplicated   ? Rehab Potential Good   ? PT Frequency 2x / week   ? PT Duration 6 weeks   ? PT Treatment/Interventions Therapeutic exercise;Patient/family education;Functional mobility training;Manual techniques   ?  PT Next Visit Plan .  continue to Work on posture,  as well as core strengthening,  educate in body mechanics for lifting, pushing pulling; pelvic tilts,   ? PT Home Exercise Plan knee to chest,  prone on elbow  with 2 pillows under abdomen, ab set, bridge 2/15 LTR 2/24 seated HS stretch, doorway stretch; all used after heat   ? Consulted and Agree with Plan of Care Patient   ? ?  ?  ? ?  ? ? ? ?10:02 AM, 11/22/21 ?Wyman Songster PT, DPT ?Physical Therapist at [facility] ?[facility] ? ? ?   ?

## 2021-11-24 ENCOUNTER — Other Ambulatory Visit: Payer: Self-pay

## 2021-11-24 ENCOUNTER — Ambulatory Visit (HOSPITAL_COMMUNITY): Payer: BC Managed Care – PPO | Admitting: Physical Therapy

## 2021-11-24 ENCOUNTER — Encounter (HOSPITAL_COMMUNITY): Payer: Self-pay | Admitting: Physical Therapy

## 2021-11-24 DIAGNOSIS — M5442 Lumbago with sciatica, left side: Secondary | ICD-10-CM | POA: Diagnosis not present

## 2021-11-24 NOTE — Therapy (Signed)
?OUTPATIENT PHYSICAL THERAPY TREATMENT NOTE/PROGRESS NOTE/Recert ? ? ?Patient Name: [name] ?MRN: [id number] ?DOB:March 11, 1966, 56 y.o., male ?Today's Date: 11/24/2021 ? ?PCP: Sharilyn Sites, MD ?REFERRING PROVIDER: Roe Coombs ? ?Progress Note  ? ?Reporting Period 10/21/21 to 11/24/21 ? ? See note below for Objective Data and Assessment of Progress/Goals  ? ? PT End of Session - 11/24/21 0921   ? ? Visit Number 10   ? Number of Visits 20   ? Date for PT Re-Evaluation 12/22/21   ? Authorization Type BCBS   ? Authorization - Visit Number 10   ? Authorization - Number of Visits 30   ? Progress Note Due on Visit 20   ? PT Start Time [phone]   ? PT Stop Time 1000   ? PT Time Calculation (min) 38 min   ? Activity Tolerance Patient tolerated treatment well   ? Behavior During Therapy [employer] for tasks assessed/performed   ? ?  ?  ? ?  ? ? ?Past Medical History:  ?Diagnosis Date  ? Body mass index (BMI) of 19 or less in adult 2007 117 LBS  ? GERD (gastroesophageal reflux disease)   ? IBS (irritable bowel syndrome)   ? IBS (irritable bowel syndrome)   ? ?Past Surgical History:  ?Procedure Laterality Date  ? ESOPHAGOGASTRODUODENOSCOPY  SEP 2007 EPIG PAIN, DIARRHEA  ? MILD GASTRITIS/DUODENITIS, NO CELIAC SPRUE, HH,  SCHATZKI'S RING  ? ?Patient Active Problem List  ? Diagnosis Date Noted  ? SMOKER 07/05/2009  ? GERD 07/05/2009  ? IRRITABLE BOWEL SYNDROME 07/05/2009  ? ? ?REFERRING DIAG: Back pain ? ?THERAPY DIAG:  ?Chronic bilateral low back pain with bilateral sciatica ? ?Muscle weakness (generalized) ? ?PERTINENT HISTORY: n/a ? ?PRECAUTIONS: None ? ?SUBJECTIVE: Patient was really sore in low back after last session and then sitting in the car for an hour drive. He went to spinal [facility] yesterday and they are going to give him some steroids and muscle relaxer. They are signing him up for short term disability and want to see him back in 4 weeks. He has been working on [employer]. He states 60% improvement with PT intervention. He states  remaining deficit as continued symptoms.  ? ?PAIN:  ?Are you having pain? Yes: NPRS scale: 5/10 ?Pain location: low back  ?Pain description: aching, throbbing, tight ?Aggravating factors: standing/walking ?Relieving factors: steroid shot, heat ? ?Objective: ?Palpation: patient TTP to R lumbar paraspinals and QL ? ?AROM:  ?  11/24/2021     ? Flexion  25% limited * worst    ? Extension  50% limited -stiff    ? R ROT  25% limited *    ? L ROT  25% limited *L>R    ? R SB  13 cm *    ?L SB 13 cm   *   ?  * Pain   ?(Blank rows = not tested) ? ?LE ROM: WFL for tasks assessed ? ?Active ROM Right ?11/24/2021 Left ?11/24/2021  ?Hip flexion    ?Hip extension    ?Hip abduction    ?Hip adduction    ?Hip internal rotation    ?Hip external rotation    ?Knee flexion    ?Knee extension    ?Ankle dorsiflexion    ?Ankle plantarflexion    ?Ankle inversion    ?Ankle eversion    ? (Blank rows = not tested) ?*= pain ? ?LE MMT: ? ?MMT Right ?11/24/2021 Left ?11/24/2021  ?Hip flexion 4+/5 * 4/5*  ?Hip extension 4/5 *  4/5 *  ?Hip abduction 4/5 * 4/5 *  ?Hip adduction    ?Hip internal rotation    ?Hip external rotation    ?Knee flexion 4+/5 4+/5  ?Knee extension 4+/5 4+/5  ?Ankle dorsiflexion 5/5/  5/5  ?Ankle plantarflexion    ?Ankle inversion    ?Ankle eversion    ? (Blank rows = not tested) ?*= pain  ? ? ?TODAY'S TREATMENT:  ?11/24/21 ?Update of objective measures ?Trunk flexion stretch 3 way with red ball 10 second holds x 5 each direction ?Hip hike on 4 inch step for R QL 2x 10  ?Overhead QL stretch 5 x 10 second holds for R ? ? ?11/22/21 ?Trunk flexion stretch 3 way with red ball 10 second holds x 5 each direction ?DKTC with heels on red ball 10x 5 second holds ?IASTM with percussion gun level 10 to lumbar paraspinals and QL in prone ?Overhead QL stretch 5 x 10 second holds for R ?Contract relax for R QL in sidelying 8x 5 second holds ? ? ?11/17/21 ?Prone:  deep breaths to relax ?             Glut sets x 10 ?             Heel sets x 10 ?               POE 1 minute x 3 with deep breaths ?Supine:  Ab sets with legs on bolster x10 ?               B UE flexion to 90 with stability x5 ?               Knee to chest x 3 for 30" ?               Hamstring stretch B 3 x 30"  ?               Trunk rotation  ?               Scapular and cervical retraction x  10  ? ? ?PATIENT EDUCATION: ?Education details: HEP ?Person educated: Patient ?Education method: Explanation and Demonstration ?Education comprehension: verbalized understanding and returned demonstration ? ? ?HOME EXERCISE PROGRAM: ?knee to chest,  prone on elbow with 2 pillows under abdomen, ab set, bridge 2/15 LTR 2/24 seated HS stretch, doorway stretch; all used after heat  11/24/21 hip hike for R QL ? ? PT Short Term Goals   ? ?  ? PT SHORT TERM GOAL #1  ? Title PT to be I in HEP to have functional back motion   ? Time 3   ? Period Weeks   ? Status Achieved  ? Target Date 11/11/21   ?  ? PT SHORT TERM GOAL #2  ? Title PT to be able to demonstrate proper body mechanics for lifting, pusing pulling  bed mobility to decrease stress off low back and allow pain level to be no greater than 6/10   ? Time 3   ? Period Weeks   ? Status On-going   ?  ? PT SHORT TERM GOAL #3  ? Title Pt LE and core strength to be increase by 1/2 grade to allow pt to tolerate standing x 10 minutes without increased pain.   ? Period Weeks   ? Status Achieved  ? Target Date 11/11/21   ? ?  ?  ? ?  ? ? ? PT Long Term Goals   ? ?  ?  PT LONG TERM GOAL #1  ? Title Pt to be I in advanced HEP to improve posture so that pt does not exhibit a posterior lean when standing   ? Time 6   ? Period Weeks   ? Status On-going   ? Target Date 12/02/21   ?  ? PT LONG TERM GOAL #2  ? Title Pt to state that he is able to complete a full day work without low back pain going above a 2/10   ? Time 6   ? Period Weeks   ? Status On-going   ?  ? PT LONG TERM GOAL #3  ? Title Pt LE to increase one grade to be able to come sit to stand without increased back pain.   ?  Time 6   ? Period Weeks   ? Status On-going   ?  ? PT LONG TERM GOAL #4  ? Title Pt core strength and knowledge of body mechanics to increase to allow pt to be able to work on his cars for an hour without increased back pain.   ? Time 6   ? Period Weeks   ? Status On-going   ? ?  ?  ? ?  ? ? ? Plan   ? ? Clinical Impression Statement Patient has met 2/3 short term goals and 0/4 long term goals with ability to complete HEP and improvement in strength. Patient demonstrating improvement in ROM, strength, standing tolerance, and functional mobility. Patient continues to be limited by low back symptoms, strength and mobility deficits leading to remaining goals not met. Extending POC to continue to progress toward goals as patient will be beginning steroid pack and anti inflammatories which may assist with symptoms.  Recent symptom increase likely due to muscle soreness especially after targeting QL last session and patient sitting in car for extended period of time after last session. Patient will continue to benefit from physical therapy to reduce impairment and improve function.   ? Personal Factors and Comorbidities Time since onset of injury/illness/exacerbation   ? Examination-Activity Limitations Bend;Carry;Lift;Stand;Stairs;Squat;Sit   ? Examination-Participation Restrictions Cleaning;Other;Occupation   ? Stability/Clinical Decision Making Stable/Uncomplicated   ? Rehab Potential Good   ? PT Frequency 2x / week   ? PT Duration 4 weeks   ? PT Treatment/Interventions Therapeutic exercise;Patient/family education;Functional mobility training;Manual techniques   ? PT Next Visit Plan .  continue to Work on posture,  as well as core strengthening,  educate in body mechanics for lifting, pushing pulling; pelvic tilts,   ? PT Home Exercise Plan knee to chest,  prone on elbow with 2 pillows under abdomen, ab set, bridge 2/15 LTR 2/24 seated HS stretch, doorway stretch; all used after heat   ? Consulted and Agree with Plan  of Care Patient   ? ?  ?  ? ?  ? ? ? ?9:31 AM, 11/24/21 ?Mearl Latin PT, DPT ?Physical Therapist at [facility] ?[facility] ? ? ?   ?

## 2021-11-24 NOTE — Patient Instructions (Signed)
Access Code: [email or web address] ?URL: [email or web address] ?Date: 11/24/2021 ?Prepared by: Greig Castilla Holland Nickson ? ?Exercises ?Hip Hiking on Step (Mirrored) - 1 x daily - 7 x weekly - 3 sets - 10 reps ? ?

## 2021-11-29 ENCOUNTER — Ambulatory Visit (HOSPITAL_COMMUNITY): Payer: BC Managed Care – PPO

## 2021-11-29 ENCOUNTER — Other Ambulatory Visit: Payer: Self-pay

## 2021-11-29 ENCOUNTER — Encounter (HOSPITAL_COMMUNITY): Payer: Self-pay | Admitting: Physical Therapy

## 2021-11-29 DIAGNOSIS — G8929 Other chronic pain: Secondary | ICD-10-CM

## 2021-11-29 DIAGNOSIS — M5442 Lumbago with sciatica, left side: Secondary | ICD-10-CM | POA: Diagnosis not present

## 2021-11-29 DIAGNOSIS — M5416 Radiculopathy, lumbar region: Secondary | ICD-10-CM

## 2021-11-29 DIAGNOSIS — M6281 Muscle weakness (generalized): Secondary | ICD-10-CM

## 2021-11-29 NOTE — Therapy (Signed)
?OUTPATIENT PHYSICAL THERAPY TREATMENT NOTE/PROGRESS NOTE/Recert ? ? ?Patient Name: [name] ?MRN: [id number] ?DOB:1966/04/13, 56 y.o., male ?Today's Date: 11/29/2021 ? ?PCP: Assunta Found, MD ?REFERRING PROVIDER: Georgette Shell ? ?Progress Note  ? ?Reporting Period 10/21/21 to 11/24/21 ? ? See note below for Objective Data and Assessment of Progress/Goals  ? ? PT End of Session - 11/29/21 1146   ? ? Visit Number 11   ? Number of Visits 20   ? Date for PT Re-Evaluation 12/22/21   ? Authorization Type BCBS   ? Authorization - Visit Number 11   ? Authorization - Number of Visits 30   ? Progress Note Due on Visit 20   ? PT Start Time 1138   ? PT Stop Time 1216   ? PT Time Calculation (min) 38 min   ? Activity Tolerance Patient tolerated treatment well   ? Behavior During Therapy [facility] for tasks assessed/performed   ? ?  ?  ? ?  ? ? ?Past Medical History:  ?Diagnosis Date  ? Body mass index (BMI) of 19 or less in adult 2007 117 LBS  ? GERD (gastroesophageal reflux disease)   ? IBS (irritable bowel syndrome)   ? IBS (irritable bowel syndrome)   ? ?Past Surgical History:  ?Procedure Laterality Date  ? ESOPHAGOGASTRODUODENOSCOPY  SEP 2007 EPIG PAIN, DIARRHEA  ? MILD GASTRITIS/DUODENITIS, NO CELIAC SPRUE, HH,  SCHATZKI'S RING  ? ?Patient Active Problem List  ? Diagnosis Date Noted  ? SMOKER 07/05/2009  ? GERD 07/05/2009  ? IRRITABLE BOWEL SYNDROME 07/05/2009  ? ? ?REFERRING DIAG: Back pain ? ?THERAPY DIAG:  ?Chronic bilateral low back pain with bilateral sciatica ? ?Muscle weakness (generalized) ? ?Radiculopathy, lumbar region ? ?PERTINENT HISTORY: n/a ? ?PRECAUTIONS: None ? ?SUBJECTIVE: Pt stated he feels some improvements, was walking the dog yesterday who saw a squirrel and ran in front causing him to rotate and increased pain mid back.  Reports he had to stop taking current steroid/muscle relaxers due to blisters in mouth, plans to call MD later today. ? ?PAIN:  ?Are you having pain? Yes: NPRS scale: 5/10 ?Pain  location: low back right to center ?Pain description: aching, throbbing, tight ?Aggravating factors: standing/walking ?Relieving factors: steroid shot, heat ? ?Objective: ?Palpation: patient TTP to R lumbar paraspinals and QL ? ?AROM:  ?  11/29/2021     ? Flexion  25% limited * worst    ? Extension  50% limited -stiff    ? R ROT  25% limited *    ? L ROT  25% limited *L>R    ? R SB  13 cm *    ?L SB 13 cm   *   ?  * Pain   ?(Blank rows = not tested) ? ?LE ROM: WFL for tasks assessed ? ?Active ROM Right ?11/29/2021 Left ?11/29/2021  ?Hip flexion    ?Hip extension    ?Hip abduction    ?Hip adduction    ?Hip internal rotation    ?Hip external rotation    ?Knee flexion    ?Knee extension    ?Ankle dorsiflexion    ?Ankle plantarflexion    ?Ankle inversion    ?Ankle eversion    ? (Blank rows = not tested) ?*= pain ? ?LE MMT: ? ?MMT Right ?11/29/2021 Left ?11/29/2021  ?Hip flexion 4+/5 * 4/5*  ?Hip extension 4/5 * 4/5 *  ?Hip abduction 4/5 * 4/5 *  ?Hip adduction    ?Hip internal rotation    ?  Hip external rotation    ?Knee flexion 4+/5 4+/5  ?Knee extension 4+/5 4+/5  ?Ankle dorsiflexion 5/5/  5/5  ?Ankle plantarflexion    ?Ankle inversion    ?Ankle eversion    ? (Blank rows = not tested) ?*= pain  ? ? ?TODAY'S TREATMENT:  ?3/21:  ?Quadruped: UE 5x 5" ? LE 5x 5" ? ? Cat/camel 10x 10" ? Child's pose 3x 30" ?  2x 30" Rt only ?Supine: Trunk flexion stretch 3 way with red ball 10 second holds x 5 each direction ? DKTC with heels on red ball 10x 5 second holds ? ? ? ? ? ?11/24/21 ?Update of objective measures ?Trunk flexion stretch 3 way with red ball 10 second holds x 5 each direction ?Hip hike on 4 inch step for R QL 2x 10  ?Overhead QL stretch 5 x 10 second holds for R ? ? ?11/22/21 ?Trunk flexion stretch 3 way with red ball 10 second holds x 5 each direction ?DKTC with heels on red ball 10x 5 second holds ?IASTM with percussion gun level 10 to lumbar paraspinals and QL in prone ?Overhead QL stretch 5 x 10 second holds for  R ?Contract relax for R QL in sidelying 8x 5 second holds ? ? ?11/17/21 ?Prone:  deep breaths to relax ?             Glut sets x 10 ?             Heel sets x 10 ?              POE 1 minute x 3 with deep breaths ?Supine:  Ab sets with legs on bolster x10 ?               B UE flexion to 90 with stability x5 ?               Knee to chest x 3 for 30" ?               Hamstring stretch B 3 x 30"  ?               Trunk rotation  ?               Scapular and cervical retraction x  10  ? ? ?PATIENT EDUCATION: ?Education details: HEP ?Person educated: Patient ?Education method: Explanation and Demonstration ?Education comprehension: verbalized understanding and returned demonstration ? ? ?HOME EXERCISE PROGRAM: ?knee to chest,  prone on elbow with 2 pillows under abdomen, ab set, bridge 2/15 LTR 2/24 seated HS stretch, doorway stretch; all used after heat  11/24/21 hip hike for R QL ? ? PT Short Term Goals   ? ?  ? PT SHORT TERM GOAL #1  ? Title PT to be I in HEP to have functional back motion   ? Time 3   ? Period Weeks   ? Status Achieved  ? Target Date 11/11/21   ?  ? PT SHORT TERM GOAL #2  ? Title PT to be able to demonstrate proper body mechanics for lifting, pusing pulling  bed mobility to decrease stress off low back and allow pain level to be no greater than 6/10   ? Time 3   ? Period Weeks   ? Status On-going   ?  ? PT SHORT TERM GOAL #3  ? Title Pt LE and core strength to be increase by 1/2 grade to allow pt to tolerate standing x 10  minutes without increased pain.   ? Period Weeks   ? Status Achieved  ? Target Date 11/11/21   ? ?  ?  ? ?  ? ? ? PT Long Term Goals   ? ?  ? PT LONG TERM GOAL #1  ? Title Pt to be I in advanced HEP to improve posture so that pt does not exhibit a posterior lean when standing   ? Time 6   ? Period Weeks   ? Status On-going   ? Target Date 12/02/21   ?  ? PT LONG TERM GOAL #2  ? Title Pt to state that he is able to complete a full day work without low back pain going above a 2/10   ? Time 6    ? Period Weeks   ? Status On-going   ?  ? PT LONG TERM GOAL #3  ? Title Pt LE to increase one grade to be able to come sit to stand without increased back pain.   ? Time 6   ? Period Weeks   ? Status On-going   ?  ? PT LONG TERM GOAL #4  ? Title Pt core strength and knowledge of body mechanics to increase to allow pt to be able to work on his cars for an hour without increased back pain.   ? Time 6   ? Period Weeks   ? Status On-going   ? ?  ?  ? ?  ? ? ? Plan   ? ? Clinical Impression Statement Add spinal mobility exercises and progressed core/proximal strengthening.  Pt tolerated well wtih no reports of pain through session.  Added child's pose stretch to HEP.  ? Personal Factors and Comorbidities Time since onset of injury/illness/exacerbation   ? Examination-Activity Limitations Bend;Carry;Lift;Stand;Stairs;Squat;Sit   ? Examination-Participation Restrictions Cleaning;Other;Occupation   ? Stability/Clinical Decision Making Stable/Uncomplicated   ? Rehab Potential Good   ? PT Frequency 2x / week   ? PT Duration 4 weeks   ? PT Treatment/Interventions Therapeutic exercise;Patient/family education;Functional mobility training;Manual techniques   ? PT Next Visit Plan .  continue to Work on posture,  as well as core strengthening,  educate in body mechanics for lifting, pushing pulling; pelvic tilts,   ? PT Home Exercise Plan knee to chest,  prone on elbow with 2 pillows under abdomen, ab set, bridge 2/15 LTR 2/24 seated HS stretch, doorway stretch; all used after heat   ? Consulted and Agree with Plan of Care Patient   ? ?  ?  ? ?  ? ? ?Becky Saxasey Aryon Nham, LPTA/CLT; CBIS ?[phone] ? ?11/29/21 12:14 ? ?   ?

## 2021-12-01 ENCOUNTER — Encounter (HOSPITAL_COMMUNITY): Payer: BC Managed Care – PPO

## 2021-12-06 ENCOUNTER — Encounter (HOSPITAL_COMMUNITY): Payer: BC Managed Care – PPO | Admitting: Physical Therapy

## 2021-12-08 ENCOUNTER — Encounter (HOSPITAL_COMMUNITY): Payer: BC Managed Care – PPO | Admitting: Physical Therapy

## 2021-12-13 ENCOUNTER — Encounter (HOSPITAL_COMMUNITY): Payer: Self-pay

## 2021-12-13 ENCOUNTER — Ambulatory Visit (HOSPITAL_COMMUNITY): Payer: BC Managed Care – PPO | Attending: Physician Assistant

## 2021-12-13 DIAGNOSIS — M6281 Muscle weakness (generalized): Secondary | ICD-10-CM | POA: Insufficient documentation

## 2021-12-13 DIAGNOSIS — G8929 Other chronic pain: Secondary | ICD-10-CM | POA: Diagnosis present

## 2021-12-13 DIAGNOSIS — M5441 Lumbago with sciatica, right side: Secondary | ICD-10-CM | POA: Insufficient documentation

## 2021-12-13 DIAGNOSIS — M5442 Lumbago with sciatica, left side: Secondary | ICD-10-CM | POA: Diagnosis present

## 2021-12-13 DIAGNOSIS — M5416 Radiculopathy, lumbar region: Secondary | ICD-10-CM | POA: Insufficient documentation

## 2021-12-13 NOTE — Therapy (Signed)
?OUTPATIENT PHYSICAL THERAPY TREATMENT NOTE ? ? ?Patient Name: [name] ?MRN: [id number] ?DOB:[birth year], 56 y.o., male ?Today's Date: 12/13/2021 ? ?PCP: Sharilyn Sites, MD ?REFERRING PROVIDER: Roe Coombs ? ? ? ? ? PT End of Session - 12/13/21 0904   ? ? Visit Number 12   ? Number of Visits 20   ? Date for PT Re-Evaluation 12/22/21   ? Authorization Type BCBS   ? Authorization - Visit Number 12   ? Authorization - Number of Visits 30   ? Progress Note Due on Visit 20   ? PT Start Time [id number]   ? PT Stop Time [phone]   ? PT Time Calculation (min) 40 min   ? Activity Tolerance Patient tolerated treatment well   ? Behavior During Therapy [facility] for tasks assessed/performed   ? ?  ?  ? ?  ? ? ?Past Medical History:  ?Diagnosis Date  ? Body mass index (BMI) of 19 or less in adult 2007 117 LBS  ? GERD (gastroesophageal reflux disease)   ? IBS (irritable bowel syndrome)   ? IBS (irritable bowel syndrome)   ? ?Past Surgical History:  ?Procedure Laterality Date  ? ESOPHAGOGASTRODUODENOSCOPY  SEP 2007 EPIG PAIN, DIARRHEA  ? MILD GASTRITIS/DUODENITIS, NO CELIAC SPRUE, HH,  SCHATZKI'S RING  ? ?Patient Active Problem List  ? Diagnosis Date Noted  ? SMOKER 07/05/2009  ? GERD 07/05/2009  ? IRRITABLE BOWEL SYNDROME 07/05/2009  ? ? ?REFERRING DIAG: Back pain ? ?THERAPY DIAG:  ?Chronic bilateral low back pain with bilateral sciatica ? ?Muscle weakness (generalized) ? ?Radiculopathy, lumbar region ? ?PERTINENT HISTORY: n/a ? ?PRECAUTIONS: None ? ?SUBJECTIVE: Pt returns after two week from stomach bug, feeling healthy now but back pain continues. He did some sweeping after some home construction which caused more pain yesterday as well.  Did HEP and best with doorway stretch in morning. Is now on short term disability for work to best allow focus on spine help.  ? ?PAIN:  ?Are you having pain? Yes: NPRS scale: 5/10 ?Pain location: low back right to center ?Pain description: aching, throbbing, tight ?Aggravating factors:  standing/walking ?Relieving factors: steroid shot, heat ? ?Objective: ? ? ?TODAY'S TREATMENT:  ?12/13/21:    ? Quadruped =  ? Cat/camel 10 x 10"  - patient states pulling and opening during downward movement (extension) ? Child's pose 3 x 30"  ? Child's pose with lateral reach 2 x 30" bilaterally  ? Standing =  ? Countertop lumbar flexion/modified child's pose pull off counter top 3 x 30" ? Doorway stretch lumbar extension 10 x 10" ? Countertop lumbar flexion/extension full modulations (modified cat/camel) 10 x 10" ?Seated = big ball roll outs with big blue swiss ball  10 x 10' ?Supine = lower trunk rotations 2 x 10 reps ? Core marching 2 x 10 reps ? ? ? ?3/21:  ?Quadruped: UE 5x 5" ? LE 5x 5" ? ? Cat/camel 10x 10" ? Child's pose 3x 30" ?  2x 30" Rt only ?Supine: Trunk flexion stretch 3 way with red ball 10 second holds x 5 each direction ? DKTC with heels on red ball 10x 5 second holds ? ? ? ?11/24/21 ?Update of objective measures ?Trunk flexion stretch 3 way with red ball 10 second holds x 5 each direction ?Hip hike on 4 inch step for R QL 2x 10  ?Overhead QL stretch 5 x 10 second holds for R ? ? ?11/22/21 ?Trunk flexion stretch 3 way with red ball 10 second  holds x 5 each direction ?DKTC with heels on red ball 10x 5 second holds ?IASTM with percussion gun level 10 to lumbar paraspinals and QL in prone ?Overhead QL stretch 5 x 10 second holds for R ?Contract relax for R QL in sidelying 8x 5 second holds ? ? ?11/17/21 ?Prone:  deep breaths to relax ?             Glut sets x 10 ?             Heel sets x 10 ?              POE 1 minute x 3 with deep breaths ?Supine:  Ab sets with legs on bolster x10 ?               B UE flexion to 90 with stability x5 ?               Knee to chest x 3 for 30" ?               Hamstring stretch B 3 x 30"  ?               Trunk rotation  ?               Scapular and cervical retraction x  10  ? ? ?PATIENT EDUCATION: ?Education details: HEP ?Person educated: Patient ?Education method:  Explanation and Demonstration ?Education comprehension: verbalized understanding and returned demonstration ? ? ?HOME EXERCISE PROGRAM: ?knee to chest,  prone on elbow with 2 pillows under abdomen, ab set, bridge 2/15 LTR 2/24 seated HS stretch, doorway stretch; all used after heat  11/24/21 hip hike for R QL ? ? PT Short Term Goals   ? ?  ? PT SHORT TERM GOAL #1  ? Title PT to be I in HEP to have functional back motion   ? Time 3   ? Period Weeks   ? Status Achieved  ? Target Date 11/11/21   ?  ? PT SHORT TERM GOAL #2  ? Title PT to be able to demonstrate proper body mechanics for lifting, pusing pulling  bed mobility to decrease stress off low back and allow pain level to be no greater than 6/10   ? Time 3   ? Period Weeks   ? Status On-going   ?  ? PT SHORT TERM GOAL #3  ? Title Pt LE and core strength to be increase by 1/2 grade to allow pt to tolerate standing x 10 minutes without increased pain.   ? Period Weeks   ? Status Achieved  ? Target Date 11/11/21   ? ?  ?  ? ?  ? ? ? PT Long Term Goals   ? ?  ? PT LONG TERM GOAL #1  ? Title Pt to be I in advanced HEP to improve posture so that pt does not exhibit a posterior lean when standing   ? Time 6   ? Period Weeks   ? Status On-going   ? Target Date 12/02/21   ?  ? PT LONG TERM GOAL #2  ? Title Pt to state that he is able to complete a full day work without low back pain going above a 2/10   ? Time 6   ? Period Weeks   ? Status On-going   ?  ? PT LONG TERM GOAL #3  ? Title Pt LE to increase one grade to be able to  come sit to stand without increased back pain.   ? Time 6   ? Period Weeks   ? Status On-going   ?  ? PT LONG TERM GOAL #4  ? Title Pt core strength and knowledge of body mechanics to increase to allow pt to be able to work on his cars for an hour without increased back pain.   ? Time 6   ? Period Weeks   ? Status On-going   ? ?  ?  ? ?  ? ? ? Plan   ? ? Clinical Impression Statement Today's session focused on overall review of activities after a two  week delay in treatment secondary to patient illness.  Review of prior movements to aid in find positioning bias if able and patient demonstrating release during both flexion and extension activities. Thus, no bias needed as patient response is mostly likely a gross release of overall muscle tension and patient continues with overall stiffness in all lumbar movements through observed activities.  Progressing yoga positioning to standing modified for ease without lifestyle. He overall reports best at end of session. Patient is a good candidate for continued skilled physical therapy to progress goals and return to prior level of function.   ? Personal Factors and Comorbidities Time since onset of injury/illness/exacerbation   ? Examination-Activity Limitations Bend;Carry;Lift;Stand;Stairs;Squat;Sit   ? Examination-Participation Restrictions Cleaning;Other;Occupation   ? Stability/Clinical Decision Making Stable/Uncomplicated   ? Rehab Potential Good   ? PT Frequency 2x / week   ? PT Duration 4 weeks   ? PT Treatment/Interventions Therapeutic exercise;Patient/family education;Functional mobility training;Manual techniques   ? PT Next Visit Plan .  continue to Work on posture,  as well as core strengthening,  educate in body mechanics for lifting, pushing pulling; pelvic tilts,   ? PT Home Exercise Plan knee to chest,  prone on elbow with 2 pillows under abdomen, ab set, bridge 2/15 LTR 2/24 seated HS stretch, doorway stretch; all used after heat   ? Consulted and Agree with Plan of Care Patient   ? ?  ?  ? ?  ? ? ?9:05 AM, 12/13/21 ? ?Margarette Asal Carlis Abbott, PT, DPT  ?Contract Physical Therapist at  ?[facility] ?[phone] ? ? ?   ?

## 2021-12-15 ENCOUNTER — Ambulatory Visit (HOSPITAL_COMMUNITY): Payer: BC Managed Care – PPO | Admitting: Physical Therapy

## 2021-12-15 DIAGNOSIS — M5442 Lumbago with sciatica, left side: Secondary | ICD-10-CM | POA: Diagnosis not present

## 2021-12-15 DIAGNOSIS — G8929 Other chronic pain: Secondary | ICD-10-CM

## 2021-12-15 DIAGNOSIS — M5416 Radiculopathy, lumbar region: Secondary | ICD-10-CM

## 2021-12-15 DIAGNOSIS — M6281 Muscle weakness (generalized): Secondary | ICD-10-CM

## 2021-12-15 NOTE — Therapy (Signed)
?OUTPATIENT PHYSICAL THERAPY TREATMENT NOTE ? ? ?Patient Name: [name] ?MRN: [id number] ?DOB:[birth year], 56 y.o., male ?Today's Date: 12/15/2021 ? ?PCP: Assunta Found, MD ?REFERRING PROVIDER: Georgette Shell ? ? ? ? ? PT End of Session - 12/15/21 0830   ? ? Visit Number 13   ? Number of Visits 20   ? Date for PT Re-Evaluation 12/22/21   ? Authorization Type BCBS   ? Authorization - Visit Number 13   ? Authorization - Number of Visits 30   ? Progress Note Due on Visit 20   ? PT Start Time 0830   ? PT Stop Time 0920   ? PT Time Calculation (min) 50 min   ? Activity Tolerance Patient tolerated treatment well   ? Behavior During Therapy [facility] for tasks assessed/performed   ? ?  ?  ? ?  ? ? ?Past Medical History:  ?Diagnosis Date  ? Body mass index (BMI) of 19 or less in adult 2007 117 LBS  ? GERD (gastroesophageal reflux disease)   ? IBS (irritable bowel syndrome)   ? IBS (irritable bowel syndrome)   ? ?Past Surgical History:  ?Procedure Laterality Date  ? ESOPHAGOGASTRODUODENOSCOPY  SEP 2007 EPIG PAIN, DIARRHEA  ? MILD GASTRITIS/DUODENITIS, NO CELIAC SPRUE, HH,  SCHATZKI'S RING  ? ?Patient Active Problem List  ? Diagnosis Date Noted  ? SMOKER 07/05/2009  ? GERD 07/05/2009  ? IRRITABLE BOWEL SYNDROME 07/05/2009  ? ? ?REFERRING DIAG: Back pain ? ?THERAPY DIAG:  ?Chronic bilateral low back pain with bilateral sciatica ? ?Muscle weakness (generalized) ? ?Radiculopathy, lumbar region ? ?PERTINENT HISTORY: n/a ? ?PRECAUTIONS: None ? ?SUBJECTIVE: Pt states his pain is still present but slightly reduced from last session ?PAIN:  ?Are you having pain? Yes: NPRS scale: 4/10 ?Pain location: low back right to center ?Pain description: aching, throbbing, tight ?Aggravating factors: standing/walking ?Relieving factors: steroid shot, heat ? ?Objective: ? ? ?TODAY'S TREATMENT:  ?12/15/21: ? Quadruped =  ? Cat/camel 10 x 10"   ? Child's pose 3 x 30"  ? Child's pose with lateral reach 2 x 30" ea bilaterally  ? Seated =  ? big ball roll  outs fwd and laterals with big blue swiss ball  10X10" ea ?Supine = ?  lower trunk rotations 2 x 10 reps ? Core marching 2 x 10 reps ? Bridge with feet on green ball to challenge stability 2X10 ? SLR with core stab 10X each ? ?12/13/21:    ? Quadruped =  ? Cat/camel 10 x 10"  - patient states pulling and opening during downward movement (extension) ? Child's pose 3 x 30"  ? Child's pose with lateral reach 2 x 30" bilaterally  ? Standing =  ? Countertop lumbar flexion/modified child's pose pull off counter top 3 x 30" ? Doorway stretch lumbar extension 10 x 10" ? Countertop lumbar flexion/extension full modulations (modified cat/camel) 10 x 10" ?Seated = big ball roll outs with big blue swiss ball  10 x 10' ?Supine = lower trunk rotations 2 x 10 reps ? Core marching 2 x 10 reps ? ?11/29/21:  ? Quadruped: UE 5x 5" ?  LE 5x 5" ?  Cat/camel 10x 10" ?  Child's pose 3x 30" ?  2x 30" Rt only ? Supine: Trunk flexion stretch 3 way with red ball 10 second holds x 5 each direction ?  DKTC with heels on red ball 10x 5 second holds ? ?11/24/21 ?Update of objective measures ?Trunk flexion stretch 3 way  with red ball 10 second holds x 5 each direction ?Hip hike on 4 inch step for R QL 2x 10  ?Overhead QL stretch 5 x 10 second holds for R ? ? ?11/22/21 ?Trunk flexion stretch 3 way with red ball 10 second holds x 5 each direction ?DKTC with heels on red ball 10x 5 second holds ?IASTM with percussion gun level 10 to lumbar paraspinals and QL in prone ?Overhead QL stretch 5 x 10 second holds for R ?Contract relax for R QL in sidelying 8x 5 second holds ? ? ?11/17/21 ?Prone:  deep breaths to relax ?             Glut sets x 10 ?             Heel sets x 10 ?              POE 1 minute x 3 with deep breaths ?Supine:  Ab sets with legs on bolster x10 ?               B UE flexion to 90 with stability x5 ?               Knee to chest x 3 for 30" ?               Hamstring stretch B 3 x 30"  ?               Trunk rotation  ?               Scapular and  cervical retraction x  10  ? ? ?PATIENT EDUCATION: ?Education details: HEP ?Person educated: Patient ?Education method: Explanation and Demonstration ?Education comprehension: verbalized understanding and returned demonstration ? ? ?HOME EXERCISE PROGRAM: ?knee to chest,  prone on elbow with 2 pillows under abdomen, ab set, bridge 2/15 LTR 2/24 seated HS stretch, doorway stretch; all used after heat  11/24/21 hip hike for R QL ? ? PT Short Term Goals   ? ?  ? PT SHORT TERM GOAL #1  ? Title PT to be I in HEP to have functional back motion   ? Time 3   ? Period Weeks   ? Status Achieved  ? Target Date 11/11/21   ?  ? PT SHORT TERM GOAL #2  ? Title PT to be able to demonstrate proper body mechanics for lifting, pusing pulling  bed mobility to decrease stress off low back and allow pain level to be no greater than 6/10   ? Time 3   ? Period Weeks   ? Status On-going   ?  ? PT SHORT TERM GOAL #3  ? Title Pt LE and core strength to be increase by 1/2 grade to allow pt to tolerate standing x 10 minutes without increased pain.   ? Period Weeks   ? Status Achieved  ? Target Date 11/11/21   ? ?  ?  ? ?  ? ? ? PT Long Term Goals   ? ?  ? PT LONG TERM GOAL #1  ? Title Pt to be I in advanced HEP to improve posture so that pt does not exhibit a posterior lean when standing   ? Time 6   ? Period Weeks   ? Status On-going   ? Target Date 12/02/21   ?  ? PT LONG TERM GOAL #2  ? Title Pt to state that he is able to complete a full day work without  low back pain going above a 2/10   ? Time 6   ? Period Weeks   ? Status On-going   ?  ? PT LONG TERM GOAL #3  ? Title Pt LE to increase one grade to be able to come sit to stand without increased back pain.   ? Time 6   ? Period Weeks   ? Status On-going   ?  ? PT LONG TERM GOAL #4  ? Title Pt core strength and knowledge of body mechanics to increase to allow pt to be able to work on his cars for an hour without increased back pain.   ? Time 6   ? Period Weeks   ? Status On-going   ? ?  ?   ? ?  ? ? ? Plan   ? ? Clinical Impression Statement Today's session with continued focus on core stab, postural strengthening and lumbar flexibility. Pt with difficulty reducing levator involvement while completing theraband postural strengthening. Added bridge with control of physioball to activate core.  Pt without c/o pain or increase in symptoms during or at conclusion of session today.  Pt will continue to benefit from skilled physical therapy to progress goals and return to prior level of function.   ? Personal Factors and Comorbidities Time since onset of injury/illness/exacerbation   ? Examination-Activity Limitations Bend;Carry;Lift;Stand;Stairs;Squat;Sit   ? Examination-Participation Restrictions Cleaning;Other;Occupation   ? Stability/Clinical Decision Making Stable/Uncomplicated   ? Rehab Potential Good   ? PT Frequency 2x / week   ? PT Duration 4 weeks   ? PT Treatment/Interventions Therapeutic exercise;Patient/family education;Functional mobility training;Manual techniques   ? PT Next Visit Plan Continue to Work on posture,  as well as core strengthening,  educate in body mechanics for lifting, pushing pulling; pelvic tilts,   ?    ?    ? ?  ?  ? ?  ? ? ?10:00 AM, 12/15/21 ? ?Guliana Weyandt Kae HellerB Bora Broner, PTA/CLT, WTA ?[phone] ? ?   ?

## 2021-12-20 ENCOUNTER — Encounter (HOSPITAL_COMMUNITY): Payer: BC Managed Care – PPO | Admitting: Physical Therapy

## 2021-12-22 ENCOUNTER — Encounter (HOSPITAL_COMMUNITY): Payer: Self-pay | Admitting: Physical Therapy

## 2021-12-22 ENCOUNTER — Ambulatory Visit (HOSPITAL_COMMUNITY): Payer: BC Managed Care – PPO | Admitting: Physical Therapy

## 2021-12-22 DIAGNOSIS — M5442 Lumbago with sciatica, left side: Secondary | ICD-10-CM | POA: Diagnosis not present

## 2021-12-22 DIAGNOSIS — G8929 Other chronic pain: Secondary | ICD-10-CM

## 2021-12-22 DIAGNOSIS — M5416 Radiculopathy, lumbar region: Secondary | ICD-10-CM

## 2021-12-22 DIAGNOSIS — M6281 Muscle weakness (generalized): Secondary | ICD-10-CM

## 2021-12-22 NOTE — Therapy (Addendum)
?OUTPATIENT PHYSICAL THERAPY TREATMENT NOTE ? ? ?Patient Name: [name] ?MRN: [id number] ?DOB:February 26, 1966, 56 y.o., male ?Today's Date: 12/22/2021 ? ?PCP: Sharilyn Sites, MD ?REFERRING PROVIDER: Roe Coombs ? ?Progress Note  ? ?Reporting Period 11/24/21 to 12/22/21 ? ? See note below for Objective Data and Assessment of Progress/Goals  ? ? ? PT End of Session - 12/22/21 1001   ? ? Visit Number 14   ? Number of Visits 26   ? Date for PT Re-Evaluation 01/19/22   ? Authorization Type BCBS   ? Authorization - Visit Number 14   ? Authorization - Number of Visits 30   ? Progress Note Due on Visit 24   ? PT Start Time 1001   ? PT Stop Time 9983   ? PT Time Calculation (min) 38 min   ? Activity Tolerance Patient tolerated treatment well   ? Behavior During Therapy [facility] for tasks assessed/performed   ? ?  ?  ? ?  ? ? ?Past Medical History:  ?Diagnosis Date  ? Body mass index (BMI) of 19 or less in adult 2007 117 LBS  ? GERD (gastroesophageal reflux disease)   ? IBS (irritable bowel syndrome)   ? IBS (irritable bowel syndrome)   ? ?Past Surgical History:  ?Procedure Laterality Date  ? ESOPHAGOGASTRODUODENOSCOPY  SEP 2007 EPIG PAIN, DIARRHEA  ? MILD GASTRITIS/DUODENITIS, NO CELIAC SPRUE, HH,  SCHATZKI'S RING  ? ?Patient Active Problem List  ? Diagnosis Date Noted  ? SMOKER 07/05/2009  ? GERD 07/05/2009  ? IRRITABLE BOWEL SYNDROME 07/05/2009  ? ? ?REFERRING DIAG: Back pain ? ?THERAPY DIAG:  ?Chronic bilateral low back pain with bilateral sciatica - Plan: PT plan of care cert/re-cert ? ?Muscle weakness (generalized) - Plan: PT plan of care cert/re-cert ? ?Radiculopathy, lumbar region - Plan: PT plan of care cert/re-cert ? ?PERTINENT HISTORY: n/a ? ?PRECAUTIONS: None ? ?SUBJECTIVE: Patient states he was doing a little better until yesterday. He had to rake and shovel. He cant be on his feet for more than 3-4 hours before symptoms get too bad. Has been doing HEP. Someday's are better than others. Has not been able to do much  physical work and get done what he needs. He states 50% improvement since beginning PT.  ?PAIN:  ?Are you having pain? Yes: NPRS scale: 6/10 ?Pain location: low back right to center ?Pain description: aching, throbbing, tight ?Aggravating factors: standing/walking ?Relieving factors: steroid shot, heat ? ?Objective: ?Lumbar AROM:  ?  11/24/2021   12/22/21  ? Flexion  25% limited * worst  25% limited * worst  ? Extension  50% limited -stiff  25% limited *   ? R ROT  25% limited *  25% limited *  ? L ROT  25% limited *L>R  25% limited *  ? R SB  13 cm *  17 cm *  ?L SB 13 cm   *  16 cm  ?                        * Pain                ?(Blank rows = not tested) ?  ? ?LE MMT: ?  ?MMT Right ?11/24/2021 Left ?11/24/2021 R 12/22/21 L 12/22/21  ?Hip flexion 4+/5 * 4/5* 4+/5 * 4/5*  ?Hip extension 4/5 * 4/5 * 4+/5 * 4+/5 *  ?Hip abduction 4/5 * 4/5 * 4+/5 * 4+/5 *  ?Hip adduction        ?  Hip internal rotation        ?Hip external rotation        ?Knee flexion 4+/5 4+/5 4+/5 * 4+/5 *  ?Knee extension 4+/5 4+/5 5/5 * 5/5 *  ?Ankle dorsiflexion 5/5/  5/5    ?Ankle plantarflexion        ?Ankle inversion        ?Ankle eversion        ? (Blank rows = not tested) ?*= pain  ? ? Palpation: TTP lumbar paraspinals, QL mostly R sided ? ?TODAY'S TREATMENT:  ?12/22/21 ?Reassessment ?Shoulder extension with green band with marching and abdominal activation 3x 5  ? ?12/15/21: ? Quadruped =  ? Cat/camel 10 x 10"   ? Child's pose 3 x 30"  ? Child's pose with lateral reach 2 x 30" ea bilaterally  ? Seated =  ? big ball roll outs fwd and laterals with big blue swiss ball  10X10" ea ?Supine = ?  lower trunk rotations 2 x 10 reps ? Core marching 2 x 10 reps ? Bridge with feet on green ball to challenge stability 2X10 ? SLR with core stab 10X each ? ?12/13/21:    ? Quadruped =  ? Cat/camel 10 x 10"  - patient states pulling and opening during downward movement (extension) ? Child's pose 3 x 30"  ? Child's pose with lateral reach 2 x 30" bilaterally   ? Standing =  ? Countertop lumbar flexion/modified child's pose pull off counter top 3 x 30" ? Doorway stretch lumbar extension 10 x 10" ? Countertop lumbar flexion/extension full modulations (modified cat/camel) 10 x 10" ?Seated = big ball roll outs with big blue swiss ball  10 x 10' ?Supine = lower trunk rotations 2 x 10 reps ? Core marching 2 x 10 reps ? ?11/29/21:  ? Quadruped: UE 5x 5" ?  LE 5x 5" ?  Cat/camel 10x 10" ?  Child's pose 3x 30" ?  2x 30" Rt only ? Supine: Trunk flexion stretch 3 way with red ball 10 second holds x 5 each direction ?  DKTC with heels on red ball 10x 5 second holds ? ?11/24/21 ?Update of objective measures ?Trunk flexion stretch 3 way with red ball 10 second holds x 5 each direction ?Hip hike on 4 inch step for R QL 2x 10  ?Overhead QL stretch 5 x 10 second holds for R ? ? ?11/22/21 ?Trunk flexion stretch 3 way with red ball 10 second holds x 5 each direction ?DKTC with heels on red ball 10x 5 second holds ?IASTM with percussion gun level 10 to lumbar paraspinals and QL in prone ?Overhead QL stretch 5 x 10 second holds for R ?Contract relax for R QL in sidelying 8x 5 second holds ? ? ?11/17/21 ?Prone:  deep breaths to relax ?             Glut sets x 10 ?             Heel sets x 10 ?              POE 1 minute x 3 with deep breaths ?Supine:  Ab sets with legs on bolster x10 ?               B UE flexion to 90 with stability x5 ?               Knee to chest x 3 for 30" ?  Hamstring stretch B 3 x 30"  ?               Trunk rotation  ?               Scapular and cervical retraction x  10  ? ? ?PATIENT EDUCATION: ?Education details: HEP, reassessment findings, Dry needling, POC ?Person educated: Patient ?Education method: Explanation and Demonstration ?Education comprehension: verbalized understanding and returned demonstration ? ? ?HOME EXERCISE PROGRAM: ?knee to chest,  prone on elbow with 2 pillows under abdomen, ab set, bridge 2/15 LTR 2/24 seated HS stretch, doorway stretch;  all used after heat  11/24/21 hip hike for R QL ? ? PT Short Term Goals   ? ?  ? PT SHORT TERM GOAL #1  ? Title PT to be I in HEP to have functional back motion   ? Time 3   ? Period Weeks   ? Status Achieved  ? Target Date 11/11/21   ?  ? PT SHORT TERM GOAL #2  ? Title PT to be able to demonstrate proper body mechanics for lifting, pusing pulling  bed mobility to decrease stress off low back and allow pain level to be no greater than 6/10   ? Time 3   ? Period Weeks   ? Status Achieved  ?  ? PT SHORT TERM GOAL #3  ? Title Pt LE and core strength to be increase by 1/2 grade to allow pt to tolerate standing x 10 minutes without increased pain.   ? Period Weeks   ? Status Achieved  ? Target Date 11/11/21   ? ?  ?  ? ?  ? ? ? PT Long Term Goals   ? ?  ? PT LONG TERM GOAL #1  ? Title Pt to be I in advanced HEP to improve posture so that pt does not exhibit a posterior lean when standing   ? Time 6   ? Period Weeks   ? Status On-going   ? Target Date 12/02/21   ?  ? PT LONG TERM GOAL #2  ? Title Pt to state that he is able to complete a full day work without low back pain going above a 2/10   ? Time 6   ? Period Weeks   ? Status On-going   ?  ? PT LONG TERM GOAL #3  ? Title Pt LE to increase one grade to be able to come sit to stand without increased back pain.   ? Time 6   ? Period Weeks   ? Status Achieved  ?  ? PT LONG TERM GOAL #4  ? Title Pt core strength and knowledge of body mechanics to increase to allow pt to be able to work on his cars for an hour without increased back pain.   ? Time 6   ? Period Weeks   ? Status On-going   ? ?  ?  ? ?  ? ? ? Plan   ? ? Clinical Impression Statement Patient has met 3/3 short term goals and 1/4 long term goals with ability to complete HEP and  improving strength, symptoms, body mechanics and activity tolerance. Patient with continued symptoms, strength deficits, and hyperactive and tender musculature leading to remaining goals not met. Discussed POC and patient will continue PT  to improve deficit. Educated on possible benefit from dry needling and he is agreeable to trial. Continued core strengthening after reassessment. Patient will continue to benefit from physical  therapy in order to

## 2021-12-22 NOTE — Patient Instructions (Signed)

## 2021-12-27 ENCOUNTER — Ambulatory Visit (HOSPITAL_COMMUNITY): Payer: BC Managed Care – PPO | Admitting: Physical Therapy

## 2021-12-27 ENCOUNTER — Encounter (HOSPITAL_COMMUNITY): Payer: Self-pay | Admitting: Physical Therapy

## 2021-12-27 ENCOUNTER — Encounter (HOSPITAL_COMMUNITY): Payer: BC Managed Care – PPO | Admitting: Physical Therapy

## 2021-12-27 DIAGNOSIS — M6281 Muscle weakness (generalized): Secondary | ICD-10-CM

## 2021-12-27 DIAGNOSIS — G8929 Other chronic pain: Secondary | ICD-10-CM

## 2021-12-27 DIAGNOSIS — M5442 Lumbago with sciatica, left side: Secondary | ICD-10-CM | POA: Diagnosis not present

## 2021-12-27 DIAGNOSIS — M5416 Radiculopathy, lumbar region: Secondary | ICD-10-CM

## 2021-12-27 NOTE — Therapy (Signed)
?OUTPATIENT PHYSICAL THERAPY TREATMENT NOTE ? ? ?Patient Name: [name] ?MRN: [id number] ?DOB:[birth year], 56 y.o., male ?Today's Date: 12/27/2021 ? ?PCP: Assunta Found, MD ?REFERRING PROVIDER: Georgette Shell ? ? ? See note below for Objective Data and Assessment of Progress/Goals  ? ? ? PT End of Session - 12/27/21 0954   ? ? Visit Number 15   ? Number of Visits 26   ? Date for PT Re-Evaluation 01/19/22   ? Authorization Type BCBS   ? Authorization - Visit Number 15   ? Authorization - Number of Visits 30   ? Progress Note Due on Visit 24   ? PT Start Time [phone]   ? PT Stop Time 1028   ? PT Time Calculation (min) 38 min   ? Activity Tolerance Patient tolerated treatment well   ? Behavior During Therapy [facility] for tasks assessed/performed   ? ?  ?  ? ?  ? ? ?Past Medical History:  ?Diagnosis Date  ? Body mass index (BMI) of 19 or less in adult 2007 117 LBS  ? GERD (gastroesophageal reflux disease)   ? IBS (irritable bowel syndrome)   ? IBS (irritable bowel syndrome)   ? ?Past Surgical History:  ?Procedure Laterality Date  ? ESOPHAGOGASTRODUODENOSCOPY  SEP 2007 EPIG PAIN, DIARRHEA  ? MILD GASTRITIS/DUODENITIS, NO CELIAC SPRUE, HH,  SCHATZKI'S RING  ? ?Patient Active Problem List  ? Diagnosis Date Noted  ? SMOKER 07/05/2009  ? GERD 07/05/2009  ? IRRITABLE BOWEL SYNDROME 07/05/2009  ? ? ?REFERRING DIAG: Back pain ? ?THERAPY DIAG:  ?Chronic bilateral low back pain with bilateral sciatica ? ?Muscle weakness (generalized) ? ?Radiculopathy, lumbar region ? ?PERTINENT HISTORY: n/a ? ?PRECAUTIONS: None ? ?SUBJECTIVE: Still having back pain that comes and goes. When pain is up, he cant do much at all. It is hurting today. He is ready to try dry needling.   ? ?PAIN:  ?Are you having pain? Yes: NPRS scale: 6/10 ?Pain location: low back right to center ?Pain description: aching, throbbing, tight ?Aggravating factors: standing/walking ?Relieving factors: steroid shot, heat ? ?Objective: ?Lumbar AROM:  ?  11/24/2021   12/22/21  ?  Flexion  25% limited * worst  25% limited * worst  ? Extension  50% limited -stiff  25% limited *   ? R ROT  25% limited *  25% limited *  ? L ROT  25% limited *L>R  25% limited *  ? R SB  13 cm *  17 cm *  ?L SB 13 cm   *  16 cm  ?                        * Pain                ?(Blank rows = not tested) ?  ? ?LE MMT: ?  ?MMT Right ?11/24/2021 Left ?11/24/2021 R 12/22/21 L 12/22/21  ?Hip flexion 4+/5 * 4/5* 4+/5 * 4/5*  ?Hip extension 4/5 * 4/5 * 4+/5 * 4+/5 *  ?Hip abduction 4/5 * 4/5 * 4+/5 * 4+/5 *  ?Hip adduction        ?Hip internal rotation        ?Hip external rotation        ?Knee flexion 4+/5 4+/5 4+/5 * 4+/5 *  ?Knee extension 4+/5 4+/5 5/5 * 5/5 *  ?Ankle dorsiflexion 5/5/  5/5    ?Ankle plantarflexion        ?Ankle  inversion        ?Ankle eversion        ? (Blank rows = not tested) ?*= pain  ? ? Palpation: TTP lumbar paraspinals, QL mostly R sided ? ?TODAY'S TREATMENT:  ?12/27/21 ?DRY NEEDLING: ?Dry needling consent given? yes ?Educational handouts provided? yes ?Muscles treated: bilateral lumbar paraspinals, RT lumbar multifidus  ?Response from dry needling: Good tolerance, several twitches elicited ? ?Manual STM to bilateral lumbar paraspinals pre and post dry needling for trigger point identification and surface area preparation  ? ?Pec stretch in doorway 10 x 5"  ? ?12/22/21 ?Reassessment ?Shoulder extension with green band with marching and abdominal activation 3x 5  ? ?12/15/21: ? Quadruped =  ? Cat/camel 10 x 10"   ? Child's pose 3 x 30"  ? Child's pose with lateral reach 2 x 30" ea bilaterally  ? Seated =  ? big ball roll outs fwd and laterals with big blue swiss ball  10X10" ea ?Supine = ?  lower trunk rotations 2 x 10 reps ? Core marching 2 x 10 reps ? Bridge with feet on green ball to challenge stability 2X10 ? SLR with core stab 10X each ? ? ?PATIENT EDUCATION: ?Education details: Dry needling application, exercise form and function  ?Person educated: Patient ?Education method: Explanation and  Demonstration ?Education comprehension: verbalized understanding and returned demonstration ? ? ?HOME EXERCISE PROGRAM: ?knee to chest,  prone on elbow with 2 pillows under abdomen, ab set, bridge 2/15 LTR 2/24 seated HS stretch, doorway stretch; all used after heat  11/24/21 hip hike for R QL ? ? PT Short Term Goals   ? ?  ? PT SHORT TERM GOAL #1  ? Title PT to be I in HEP to have functional back motion   ? Time 3   ? Period Weeks   ? Status Achieved  ? Target Date 11/11/21   ?  ? PT SHORT TERM GOAL #2  ? Title PT to be able to demonstrate proper body mechanics for lifting, pusing pulling  bed mobility to decrease stress off low back and allow pain level to be no greater than 6/10   ? Time 3   ? Period Weeks   ? Status Achieved  ?  ? PT SHORT TERM GOAL #3  ? Title Pt LE and core strength to be increase by 1/2 grade to allow pt to tolerate standing x 10 minutes without increased pain.   ? Period Weeks   ? Status Achieved  ? Target Date 11/11/21   ? ?  ?  ? ?  ? ? ? PT Long Term Goals   ? ?  ? PT LONG TERM GOAL #1  ? Title Pt to be I in advanced HEP to improve posture so that pt does not exhibit a posterior lean when standing   ? Time 6   ? Period Weeks   ? Status On-going   ? Target Date 12/02/21   ?  ? PT LONG TERM GOAL #2  ? Title Pt to state that he is able to complete a full day work without low back pain going above a 2/10   ? Time 6   ? Period Weeks   ? Status On-going   ?  ? PT LONG TERM GOAL #3  ? Title Pt LE to increase one grade to be able to come sit to stand without increased back pain.   ? Time 6   ? Period Weeks   ?  Status Achieved  ?  ? PT LONG TERM GOAL #4  ? Title Pt core strength and knowledge of body mechanics to increase to allow pt to be able to work on his cars for an hour without increased back pain.   ? Time 6   ? Period Weeks   ? Status On-going   ? ?  ?  ? ?  ? ? ? Plan   ? ? Clinical Impression Statement Performed trigger point dry needling today. Patient with several notable trigger  points. Good response to treatment. Slight improvement in lumbar extension noted following, back pain "not as sharp". Will assess response next session and continue interventions as indicated.  ?  ? Personal Factors and Comorbidities Time since onset of injury/illness/exacerbation   ? Examination-Activity Limitations Bend;Carry;Lift;Stand;Stairs;Squat;Sit   ? Examination-Participation Restrictions Cleaning;Other;Occupation   ? Stability/Clinical Decision Making Stable/Uncomplicated   ? Rehab Potential Good   ? PT Frequency 1x / week   ? PT Duration 6 weeks   ? PT Treatment/Interventions Therapeutic exercise;Patient/family education;Functional mobility training;Manual techniques   ? PT Next Visit Plan F/u on DN response, continue as indicated. Continue to Work on posture,  as well as core strengthening.  ?    ?    ? ?  ?  ? ?  ? ?10:29 AM, 12/27/21 ?Georges Lynch PT DPT  ?Physical Therapist with [facility]  ?[facility]  ?[phone] ? ? ?

## 2021-12-29 ENCOUNTER — Encounter (HOSPITAL_COMMUNITY): Payer: BC Managed Care – PPO | Admitting: Physical Therapy

## 2022-01-02 ENCOUNTER — Encounter (HOSPITAL_COMMUNITY): Payer: Self-pay | Admitting: Physical Therapy

## 2022-01-02 ENCOUNTER — Ambulatory Visit (HOSPITAL_COMMUNITY): Payer: BC Managed Care – PPO | Admitting: Physical Therapy

## 2022-01-02 DIAGNOSIS — G8929 Other chronic pain: Secondary | ICD-10-CM

## 2022-01-02 DIAGNOSIS — M5442 Lumbago with sciatica, left side: Secondary | ICD-10-CM | POA: Diagnosis not present

## 2022-01-02 DIAGNOSIS — M5416 Radiculopathy, lumbar region: Secondary | ICD-10-CM

## 2022-01-02 DIAGNOSIS — M6281 Muscle weakness (generalized): Secondary | ICD-10-CM

## 2022-01-02 NOTE — Therapy (Signed)
?OUTPATIENT PHYSICAL THERAPY TREATMENT NOTE ? ? ?Patient Name: [name] ?MRN: [id number] ?DOB:[birth year], 56 y.o., male ?Today's Date: 01/02/2022 ? ?PCP: Assunta Found, MD ?REFERRING PROVIDER: Georgette Shell ? ? ? ? PT End of Session - 01/02/22 0917   ? ? Visit Number 16   ? Number of Visits 26   ? Date for PT Re-Evaluation 01/19/22   ? Authorization Type BCBS   ? Authorization - Visit Number 16   ? Authorization - Number of Visits 30   ? Progress Note Due on Visit 24   ? PT Start Time [phone]   ? PT Stop Time 0955   ? PT Time Calculation (min) 38 min   ? Activity Tolerance Patient tolerated treatment well   ? Behavior During Therapy [facility] for tasks assessed/performed   ? ?  ?  ? ?  ? ? ?Past Medical History:  ?Diagnosis Date  ? Body mass index (BMI) of 19 or less in adult 2007 117 LBS  ? GERD (gastroesophageal reflux disease)   ? IBS (irritable bowel syndrome)   ? IBS (irritable bowel syndrome)   ? ?Past Surgical History:  ?Procedure Laterality Date  ? ESOPHAGOGASTRODUODENOSCOPY  SEP 2007 EPIG PAIN, DIARRHEA  ? MILD GASTRITIS/DUODENITIS, NO CELIAC SPRUE, HH,  SCHATZKI'S RING  ? ?Patient Active Problem List  ? Diagnosis Date Noted  ? SMOKER 07/05/2009  ? GERD 07/05/2009  ? IRRITABLE BOWEL SYNDROME 07/05/2009  ? ? ?REFERRING DIAG: Back pain ? ?THERAPY DIAG:  ?Chronic bilateral low back pain with bilateral sciatica ? ?Muscle weakness (generalized) ? ?Radiculopathy, lumbar region ? ?PERTINENT HISTORY: n/a ? ?PRECAUTIONS: None ? ?SUBJECTIVE: Feels that dry needling helped. Sore the next day but the day following didn't seem as tight. Felt like it helped overall. Tail bone has been hurting for years. ? ?PAIN:  ?Are you having pain? Yes: NPRS scale: 5/10 ?Pain location: low back right to center ?Pain description: aching, throbbing, tight ?Aggravating factors: standing/walking ?Relieving factors: steroid shot, heat ? ?Objective: ?Lumbar AROM:  ?  11/24/2021   12/22/21  ? Flexion  25% limited * worst  25% limited * worst  ?  Extension  50% limited -stiff  25% limited *   ? R ROT  25% limited *  25% limited *  ? L ROT  25% limited *L>R  25% limited *  ? R SB  13 cm *  17 cm *  ?L SB 13 cm   *  16 cm  ?                        * Pain                ?(Blank rows = not tested) ?  ? ?LE MMT: ?  ?MMT Right ?11/24/2021 Left ?11/24/2021 R 12/22/21 L 12/22/21  ?Hip flexion 4+/5 * 4/5* 4+/5 * 4/5*  ?Hip extension 4/5 * 4/5 * 4+/5 * 4+/5 *  ?Hip abduction 4/5 * 4/5 * 4+/5 * 4+/5 *  ?Hip adduction        ?Hip internal rotation        ?Hip external rotation        ?Knee flexion 4+/5 4+/5 4+/5 * 4+/5 *  ?Knee extension 4+/5 4+/5 5/5 * 5/5 *  ?Ankle dorsiflexion 5/5/  5/5    ?Ankle plantarflexion        ?Ankle inversion        ?Ankle eversion        ? (  Blank rows = not tested) ?*= pain  ? ? Palpation: TTP lumbar paraspinals, QL mostly R sided ? ?TODAY'S TREATMENT:  ?01/02/22 ?DRY NEEDLING: ?Dry needling consent given? yes ?Educational handouts provided? yes ?Muscles treated: bilateral lumbar paraspinals, RT lumbar multifidus  ?Response from dry needling: Good tolerance, several twitches elicited ? ?Manual STM to bilateral lumbar paraspinals pre and post dry needling for trigger point identification and surface area preparation  ? ?Trunk flexion stretch forward and diagonals green ball 5 x 10 second holds each direction ? ? ?12/27/21 ?DRY NEEDLING: ?Dry needling consent given? yes ?Educational handouts provided? yes ?Muscles treated: bilateral lumbar paraspinals, RT lumbar multifidus  ?Response from dry needling: Good tolerance, several twitches elicited ? ?Manual STM to bilateral lumbar paraspinals pre and post dry needling for trigger point identification and surface area preparation  ? ?Pec stretch in doorway 10 x 5"  ? ?12/22/21 ?Reassessment ?Shoulder extension with green band with marching and abdominal activation 3x 5  ? ?12/15/21: ? Quadruped =  ? Cat/camel 10 x 10"   ? Child's pose 3 x 30"  ? Child's pose with lateral reach 2 x 30" ea bilaterally   ? Seated =  ? big ball roll outs fwd and laterals with big blue swiss ball  10X10" ea ?Supine = ?  lower trunk rotations 2 x 10 reps ? Core marching 2 x 10 reps ? Bridge with feet on green ball to challenge stability 2X10 ? SLR with core stab 10X each ? ? ?PATIENT EDUCATION: ?Education details: Dry needling application, exercise form and function  ?Person educated: Patient ?Education method: Explanation and Demonstration ?Education comprehension: verbalized understanding and returned demonstration ? ? ?HOME EXERCISE PROGRAM: ?knee to chest,  prone on elbow with 2 pillows under abdomen, ab set, bridge 2/15 LTR 2/24 seated HS stretch, doorway stretch; all used after heat  11/24/21 hip hike for R QL ? ? PT Short Term Goals   ? ?  ? PT SHORT TERM GOAL #1  ? Title PT to be I in HEP to have functional back motion   ? Time 3   ? Period Weeks   ? Status Achieved  ? Target Date 11/11/21   ?  ? PT SHORT TERM GOAL #2  ? Title PT to be able to demonstrate proper body mechanics for lifting, pusing pulling  bed mobility to decrease stress off low back and allow pain level to be no greater than 6/10   ? Time 3   ? Period Weeks   ? Status Achieved  ?  ? PT SHORT TERM GOAL #3  ? Title Pt LE and core strength to be increase by 1/2 grade to allow pt to tolerate standing x 10 minutes without increased pain.   ? Period Weeks   ? Status Achieved  ? Target Date 11/11/21   ? ?  ?  ? ?  ? ? ? PT Long Term Goals   ? ?  ? PT LONG TERM GOAL #1  ? Title Pt to be I in advanced HEP to improve posture so that pt does not exhibit a posterior lean when standing   ? Time 6   ? Period Weeks   ? Status On-going   ? Target Date 12/02/21   ?  ? PT LONG TERM GOAL #2  ? Title Pt to state that he is able to complete a full day work without low back pain going above a 2/10   ? Time 6   ?  Period Weeks   ? Status On-going   ?  ? PT LONG TERM GOAL #3  ? Title Pt LE to increase one grade to be able to come sit to stand without increased back pain.   ? Time 6   ?  Period Weeks   ? Status Achieved  ?  ? PT LONG TERM GOAL #4  ? Title Pt core strength and knowledge of body mechanics to increase to allow pt to be able to work on his cars for an hour without increased back pain.   ? Time 6   ? Period Weeks   ? Status On-going   ? ?  ?  ? ?  ? ? ? Plan   ? ? Clinical Impression Statement Continued with dry needling and manual therapy with twitch responses noted. No change in symptoms following but patient stated similar response last session. Tolerates mobility exercises with improvement in symptoms following. Patient will continue to benefit from physical therapy in order to improve function and reduce impairment. ?  ? Personal Factors and Comorbidities Time since onset of injury/illness/exacerbation   ? Examination-Activity Limitations Bend;Carry;Lift;Stand;Stairs;Squat;Sit   ? Examination-Participation Restrictions Cleaning;Other;Occupation   ? Stability/Clinical Decision Making Stable/Uncomplicated   ? Rehab Potential Good   ? PT Frequency 1x / week   ? PT Duration 6 weeks   ? PT Treatment/Interventions Therapeutic exercise;Patient/family education;Functional mobility training;Manual techniques   ? PT Next Visit Plan F/u on DN response, continue as indicated. Continue to Work on posture,  as well as core strengthening.  ?    ?    ? ?  ?  ? ?  ? ?9:18 AM, 01/02/22 ?Wyman SongsterAndrew S. Tab Rylee PT, DPT ?[facility] ?[facility] ? ? ? ?

## 2022-01-07 ENCOUNTER — Other Ambulatory Visit (HOSPITAL_COMMUNITY): Payer: Self-pay | Admitting: Nurse Practitioner

## 2022-01-07 ENCOUNTER — Other Ambulatory Visit: Payer: Self-pay | Admitting: Nurse Practitioner

## 2022-01-07 DIAGNOSIS — M533 Sacrococcygeal disorders, not elsewhere classified: Secondary | ICD-10-CM

## 2022-01-07 DIAGNOSIS — R29898 Other symptoms and signs involving the musculoskeletal system: Secondary | ICD-10-CM

## 2022-01-07 DIAGNOSIS — M47816 Spondylosis without myelopathy or radiculopathy, lumbar region: Secondary | ICD-10-CM

## 2022-01-11 ENCOUNTER — Ambulatory Visit (HOSPITAL_COMMUNITY): Payer: BC Managed Care – PPO | Attending: Physician Assistant | Admitting: Physical Therapy

## 2022-01-11 ENCOUNTER — Encounter (HOSPITAL_COMMUNITY): Payer: Self-pay | Admitting: Physical Therapy

## 2022-01-11 ENCOUNTER — Encounter (HOSPITAL_COMMUNITY): Payer: BC Managed Care – PPO | Admitting: Physical Therapy

## 2022-01-11 DIAGNOSIS — M5416 Radiculopathy, lumbar region: Secondary | ICD-10-CM | POA: Insufficient documentation

## 2022-01-11 DIAGNOSIS — G8929 Other chronic pain: Secondary | ICD-10-CM | POA: Diagnosis present

## 2022-01-11 DIAGNOSIS — M5442 Lumbago with sciatica, left side: Secondary | ICD-10-CM | POA: Insufficient documentation

## 2022-01-11 DIAGNOSIS — M6281 Muscle weakness (generalized): Secondary | ICD-10-CM | POA: Insufficient documentation

## 2022-01-11 DIAGNOSIS — M5441 Lumbago with sciatica, right side: Secondary | ICD-10-CM | POA: Diagnosis present

## 2022-01-11 NOTE — Therapy (Signed)
?OUTPATIENT PHYSICAL THERAPY TREATMENT NOTE ? ? ?Patient Name: [name] ?MRN: [id number] ?DOB:02/01/1966, 56 y.o., male ?Today's Date: 01/11/2022 ? ?PCP: Assunta FoundGolding, John, MD ?REFERRING PROVIDER: Georgette ShellSara Spencer ? ? ? ? PT End of Session - 01/11/22 1039   ? ? Visit Number 17   ? Number of Visits 26   ? Date for PT Re-Evaluation 01/19/22   ? Authorization Type BCBS   ? Authorization - Visit Number 17   ? Authorization - Number of Visits 30   ? Progress Note Due on Visit 24   ? PT Start Time 1037   ? PT Stop Time 1115   ? PT Time Calculation (min) 38 min   ? Activity Tolerance Patient tolerated treatment well   ? Behavior [facility] for tasks assessed/performed   ? ?  ?  ? ?  ? ? ?Past Medical History:  ?Diagnosis Date  ? Body mass index (BMI) of 19 or less in adult 2007 117 LBS  ? GERD (gastroesophageal reflux disease)   ? IBS (irritable bowel syndrome)   ? IBS (irritable bowel syndrome)   ? ?Past Surgical History:  ?Procedure Laterality Date  ? ESOPHAGOGASTRODUODENOSCOPY  SEP 2007 EPIG PAIN, DIARRHEA  ? MILD GASTRITIS/DUODENITIS, NO CELIAC SPRUE, HH,  SCHATZKI'S RING  ? ?Patient Active Problem List  ? Diagnosis Date Noted  ? SMOKER 07/05/2009  ? GERD 07/05/2009  ? IRRITABLE BOWEL SYNDROME 07/05/2009  ? ? ?REFERRING DIAG: Back pain ? ?THERAPY DIAG:  ?Chronic bilateral low back pain with bilateral sciatica ? ?Muscle weakness (generalized) ? ?Radiculopathy, lumbar region ? ?PERTINENT HISTORY: n/a ? ?PRECAUTIONS: None ? ?SUBJECTIVE: Has had some bad spasms the past few days. Today is not as bad. Hasn't done anything different. Feels Dry needling is helping. Ongoing tailbone pain.  ? ?PAIN:  ?Are you having pain? Yes: NPRS scale: 5/10 ?Pain location: low back right to center ?Pain description: aching, throbbing, tight ?Aggravating factors: standing/walking ?Relieving factors: steroid shot, heat ? ?Objective: ?Lumbar AROM:  ?  11/24/2021   12/22/21  ? Flexion  25% limited * worst  25% limited * worst  ? Extension   50% limited -stiff  25% limited *   ? R ROT  25% limited *  25% limited *  ? L ROT  25% limited *L>R  25% limited *  ? R SB  13 cm *  17 cm *  ?L SB 13 cm   *  16 cm  ?                        * Pain                ?(Blank rows = not tested) ?  ? ?LE MMT: ?  ?MMT Right ?11/24/2021 Left ?11/24/2021 R 12/22/21 L 12/22/21  ?Hip flexion 4+/5 * 4/5* 4+/5 * 4/5*  ?Hip extension 4/5 * 4/5 * 4+/5 * 4+/5 *  ?Hip abduction 4/5 * 4/5 * 4+/5 * 4+/5 *  ?Hip adduction        ?Hip internal rotation        ?Hip external rotation        ?Knee flexion 4+/5 4+/5 4+/5 * 4+/5 *  ?Knee extension 4+/5 4+/5 5/5 * 5/5 *  ?Ankle dorsiflexion 5/5/  5/5    ?Ankle plantarflexion        ?Ankle inversion        ?Ankle eversion        ? (  Blank rows = not tested) ?*= pain  ? ? Palpation: TTP lumbar paraspinals, QL mostly R sided ? ?TODAY'S TREATMENT:  ?01/11/22 ?DRY NEEDLING: ?Dry needling consent given? yes ?Educational handouts provided? yes ?Muscles treated: RT lumbar paraspinals, RT lumbar multifidus  ?Response from dry needling: Good tolerance, several twitches elicited ? ?Manual STM to RT lumbar paraspinals pre and post dry needling for trigger point identification and surface area preparation  ? ?Pec stretch in doorway 10" x 5  ?GTB palloff press 2 x 10 each  ?GTB rows 2 x 10 ?GTB extension 2 x 10 ? ?01/02/22 ?DRY NEEDLING: ?Dry needling consent given? yes ?Educational handouts provided? yes ?Muscles treated: bilateral lumbar paraspinals, RT lumbar multifidus  ?Response from dry needling: Good tolerance, several twitches elicited ? ?Manual STM to bilateral lumbar paraspinals pre and post dry needling for trigger point identification and surface area preparation  ? ?Trunk flexion stretch forward and diagonals green ball 5 x 10 second holds each direction ? ? ?12/27/21 ?DRY NEEDLING: ?Dry needling consent given? yes ?Educational handouts provided? yes ?Muscles treated: bilateral lumbar paraspinals, RT lumbar multifidus  ?Response from dry needling:  Good tolerance, several twitches elicited ? ?Manual STM to bilateral lumbar paraspinals pre and post dry needling for trigger point identification and surface area preparation  ? ?Pec stretch in doorway 10 x 5"  ? ? ?PATIENT EDUCATION: ?Education details: Dry needling application, exercise form and function  ?Person educated: Patient ?Education method: Explanation and Demonstration ?Education comprehension: verbalized understanding and returned demonstration ? ? ?HOME EXERCISE PROGRAM: ?knee to chest,  prone on elbow with 2 pillows under abdomen, ab set, bridge 2/15 LTR 2/24 seated HS stretch, doorway stretch; all used after heat  11/24/21 hip hike for R QL 01/11/22 palloff press and issued GTB  ? ? PT Short Term Goals   ? ?  ? PT SHORT TERM GOAL #1  ? Title PT to be I in HEP to have functional back motion   ? Time 3   ? Period Weeks   ? Status Achieved  ? Target Date 11/11/21   ?  ? PT SHORT TERM GOAL #2  ? Title PT to be able to demonstrate proper body mechanics for lifting, pusing pulling  bed mobility to decrease stress off low back and allow pain level to be no greater than 6/10   ? Time 3   ? Period Weeks   ? Status Achieved  ?  ? PT SHORT TERM GOAL #3  ? Title Pt LE and core strength to be increase by 1/2 grade to allow pt to tolerate standing x 10 minutes without increased pain.   ? Period Weeks   ? Status Achieved  ? Target Date 11/11/21   ? ?  ?  ? ?  ? ? ? PT Long Term Goals   ? ?  ? PT LONG TERM GOAL #1  ? Title Pt to be I in advanced HEP to improve posture so that pt does not exhibit a posterior lean when standing   ? Time 6   ? Period Weeks   ? Status On-going   ? Target Date 12/02/21   ?  ? PT LONG TERM GOAL #2  ? Title Pt to state that he is able to complete a full day work without low back pain going above a 2/10   ? Time 6   ? Period Weeks   ? Status On-going   ?  ? PT LONG TERM GOAL #3  ?  Title Pt LE to increase one grade to be able to come sit to stand without increased back pain.   ? Time 6   ?  Period Weeks   ? Status Achieved  ?  ? PT LONG TERM GOAL #4  ? Title Pt core strength and knowledge of body mechanics to increase to allow pt to be able to work on his cars for an hour without increased back pain.   ? Time 6   ? Period Weeks   ? Status On-going   ? ?  ?  ? ?  ? ? ? Plan   ? ? Clinical Impression Statement Patient tolerated session well today. Good result with dry needling. Continued palpable trigger points demo'd. Resumed band strengthening for core strength. Patein cued on proper form, posturing and hand positioning. Patient will continue to benefit from skilled therapy services to reduce remaining deficits and improve functional ability.  ?  ? Personal Factors and Comorbidities Time since onset of injury/illness/exacerbation   ? Examination-Activity Limitations Bend;Carry;Lift;Stand;Stairs;Squat;Sit   ? Examination-Participation Restrictions Cleaning;Other;Occupation   ? Stability/Clinical Decision Making Stable/Uncomplicated   ? Rehab Potential Good   ? PT Frequency 1x / week   ? PT Duration 6 weeks   ? PT Treatment/Interventions Therapeutic exercise;Patient/family education;Functional mobility training;Manual techniques   ? PT Next Visit Plan F/u on DN response, continue as indicated. Continue to Work on posture,  as well as core strengthening.  ?    ?    ? ?  ?  ? ?  ?11:09 AM, 01/11/22 ?Georges Lynch PT DPT  ?Physical Therapist with [place]  ?[facility]  ?[phone] ? ? ? ? ?

## 2022-01-16 ENCOUNTER — Encounter (HOSPITAL_COMMUNITY): Payer: BC Managed Care – PPO | Admitting: Physical Therapy

## 2022-01-17 ENCOUNTER — Encounter (HOSPITAL_COMMUNITY): Payer: BC Managed Care – PPO

## 2022-01-24 ENCOUNTER — Ambulatory Visit (HOSPITAL_COMMUNITY): Payer: BC Managed Care – PPO | Admitting: Physical Therapy

## 2022-01-24 ENCOUNTER — Encounter (HOSPITAL_COMMUNITY): Payer: Self-pay | Admitting: Physical Therapy

## 2022-01-24 DIAGNOSIS — G8929 Other chronic pain: Secondary | ICD-10-CM

## 2022-01-24 DIAGNOSIS — M5442 Lumbago with sciatica, left side: Secondary | ICD-10-CM | POA: Diagnosis not present

## 2022-01-24 DIAGNOSIS — M5416 Radiculopathy, lumbar region: Secondary | ICD-10-CM

## 2022-01-24 DIAGNOSIS — M6281 Muscle weakness (generalized): Secondary | ICD-10-CM

## 2022-01-24 NOTE — Therapy (Signed)
?OUTPATIENT PHYSICAL THERAPY TREATMENT NOTE ? ? ?Patient Name: [name] ?MRN: [id number] ?DOB:07-21-1966, 56 y.o., male ?Today's Date: 01/24/2022 ? ?PCP: Sharilyn Sites, MD ?REFERRING PROVIDER: Roe Coombs ? ? ? ? PT End of Session - 01/24/22 5465   ? ? Visit Number 18   ? Number of Visits 30   ? Date for PT Re-Evaluation 02/21/22   ? Authorization Type BCBS   ? Authorization - Visit Number 18   ? Authorization - Number of Visits 30   ? Progress Note Due on Visit 24   ? PT Start Time [phone]   ? PT Stop Time 0959   ? PT Time Calculation (min) 38 min   ? Activity Tolerance Patient tolerated treatment well   ? Behavior During Therapy [facility] for tasks assessed/performed   ? ?  ?  ? ?  ? ? ?Past Medical History:  ?Diagnosis Date  ? Body mass index (BMI) of 19 or less in adult 2007 117 LBS  ? GERD (gastroesophageal reflux disease)   ? IBS (irritable bowel syndrome)   ? IBS (irritable bowel syndrome)   ? ?Past Surgical History:  ?Procedure Laterality Date  ? ESOPHAGOGASTRODUODENOSCOPY  SEP 2007 EPIG PAIN, DIARRHEA  ? MILD GASTRITIS/DUODENITIS, NO CELIAC SPRUE, HH,  SCHATZKI'S RING  ? ?Patient Active Problem List  ? Diagnosis Date Noted  ? SMOKER 07/05/2009  ? GERD 07/05/2009  ? IRRITABLE BOWEL SYNDROME 07/05/2009  ? ? ?REFERRING DIAG: Back pain ? ?THERAPY DIAG:  ?Chronic bilateral low back pain with bilateral sciatica ? ?Muscle weakness (generalized) ? ?Radiculopathy, lumbar region ? ?PERTINENT HISTORY: n/a ? ?PRECAUTIONS: None ? ?SUBJECTIVE: Patient states he was sick last week. Back is still bothering him and back felt tighter after missing session. He usually feels sore day after needling but feels better day following that. Supposed to have MRI this Thursday. He feels that needling has been most helpful. He states 60-65% improvement since beginning therapy. He still hurts but is able to move better.  ? ?PAIN:  ?Are you having pain? Yes: NPRS scale: 5/10 ?Pain location: low back right to center ?Pain description:  aching, throbbing, tight ?Aggravating factors: standing/walking ?Relieving factors: steroid shot, heat ? ?Objective: ?Lumbar AROM:  ?  11/24/2021   12/22/21 01/24/22  ? Flexion  25% limited * worst  25% limited * worst 0% limited *  ? Extension  50% limited -stiff  25% limited *  25% limited *  ? R ROT  25% limited *  25% limited * 25% limited *  ? L ROT  25% limited *L>R  25% limited * 25% limited *  ? R SB  13 cm *  17 cm * 16 cm  ?L SB 13 cm   *  16 cm 16 cm  ?                        * Pain                ?(Blank rows = not tested) ?  ? ?LE MMT: ?  ?MMT Right ?11/24/2021 Left ?11/24/2021 R 12/22/21 L 12/22/21 R 01/24/22 L 01/24/22  ?Hip flexion 4+/5 * 4/5* 4+/5 * 4/5* 5/5* 4+/5 *  ?Hip extension 4/5 * 4/5 * 4+/5 * 4+/5 * 5/5* 5/5*  ?Hip abduction 4/5 * 4/5 * 4+/5 * 4+/5 * 5/5* 4+/5 *  ?Hip adduction          ?Hip internal rotation          ?  Hip external rotation          ?Knee flexion 4+/5 4+/5 4+/5 * 4+/5 * 5/5* 5/5*  ?Knee extension 4+/5 4+/5 5/5 * 5/5 * 5/5* 5/5*  ?Ankle dorsiflexion 5/5/  5/5      ?Ankle plantarflexion          ?Ankle inversion          ?Ankle eversion          ? (Blank rows = not tested) ?*= pain  ? ? Palpation: TTP lumbar paraspinals, QL mostly R sided 5/16 TTP lumbar paraspinals, QL mostly R sided ? ?TODAY'S TREATMENT:  ?01/24/22 ?DRY NEEDLING: ?Dry needling consent given? yes ?Educational handouts provided? yes ?Muscles treated: RT lumbar paraspinals, RT lumbar multifidus  ?Response from dry needling: Good tolerance, several twitches elicited ? ?Manual STM to RT lumbar paraspinals pre and post dry needling for trigger point identification and surface area preparation  ? ?Pelvic tilts in supine 2x 20 anterior and posterior ? ? ?01/11/22 ?DRY NEEDLING: ?Dry needling consent given? yes ?Educational handouts provided? yes ?Muscles treated: RT lumbar paraspinals, RT lumbar multifidus  ?Response from dry needling: Good tolerance, several twitches elicited ? ?Manual STM to RT lumbar paraspinals pre and post  dry needling for trigger point identification and surface area preparation  ? ?Pec stretch in doorway 10" x 5  ?GTB palloff press 2 x 10 each  ?GTB rows 2 x 10 ?GTB extension 2 x 10 ? ?01/02/22 ?DRY NEEDLING: ?Dry needling consent given? yes ?Educational handouts provided? yes ?Muscles treated: bilateral lumbar paraspinals, RT lumbar multifidus  ?Response from dry needling: Good tolerance, several twitches elicited ? ?Manual STM to bilateral lumbar paraspinals pre and post dry needling for trigger point identification and surface area preparation  ? ?Trunk flexion stretch forward and diagonals green ball 5 x 10 second holds each direction ? ? ?12/27/21 ?DRY NEEDLING: ?Dry needling consent given? yes ?Educational handouts provided? yes ?Muscles treated: bilateral lumbar paraspinals, RT lumbar multifidus  ?Response from dry needling: Good tolerance, several twitches elicited ? ?Manual STM to bilateral lumbar paraspinals pre and post dry needling for trigger point identification and surface area preparation  ? ?Pec stretch in doorway 10 x 5"  ? ? ?PATIENT EDUCATION: ?Education details: Dry needling application, exercise form and function, reassessment findings ?Person educated: Patient ?Education method: Explanation and Demonstration ?Education comprehension: verbalized understanding and returned demonstration ? ? ?HOME EXERCISE PROGRAM: ?knee to chest,  prone on elbow with 2 pillows under abdomen, ab set, bridge 2/15 LTR 2/24 seated HS stretch, doorway stretch; all used after heat  11/24/21 hip hike for R QL 01/11/22 palloff press and issued GTB 5/16 posterior pelvic tilts ? ? PT Short Term Goals   ? ?  ? PT SHORT TERM GOAL #1  ? Title PT to be I in HEP to have functional back motion   ? Time 3   ? Period Weeks   ? Status Achieved  ? Target Date 11/11/21   ?  ? PT SHORT TERM GOAL #2  ? Title PT to be able to demonstrate proper body mechanics for lifting, pusing pulling  bed mobility to decrease stress off low back and  allow pain level to be no greater than 6/10   ? Time 3   ? Period Weeks   ? Status Achieved  ?  ? PT SHORT TERM GOAL #3  ? Title Pt LE and core strength to be increase by 1/2 grade to allow pt to tolerate standing  x 10 minutes without increased pain.   ? Period Weeks   ? Status Achieved  ? Target Date 11/11/21   ? ?  ?  ? ?  ? ? ? PT Long Term Goals   ? ?  ? PT LONG TERM GOAL #1  ? Title Pt to be I in advanced HEP to improve posture so that pt does not exhibit a posterior lean when standing   ? Time 6   ? Period Weeks   ? Status On-going   ? Target Date 12/02/21   ?  ? PT LONG TERM GOAL #2  ? Title Pt to state that he is able to complete a full day work without low back pain going above a 2/10   ? Time 6   ? Period Weeks   ? Status On-going   ?  ? PT LONG TERM GOAL #3  ? Title Pt LE to increase one grade to be able to come sit to stand without increased back pain.   ? Time 6   ? Period Weeks   ? Status Achieved  ?  ? PT LONG TERM GOAL #4  ? Title Pt core strength and knowledge of body mechanics to increase to allow pt to be able to work on his cars for an hour without increased back pain.   ? Time 6   ? Period Weeks   ? Status On-going   ? ?  ?  ? ?  ? ? ? Plan   ? ? Clinical Impression Statement Patient has met 3/3 short term goals and 1/4 long term goals with ability to complete HEP and  improving strength, symptoms, body mechanics and activity tolerance. Patient with continued symptoms, strength/ROM deficits, impaired posture and hyperactive and tender musculature leading to remaining goals not met. Patient with improving ROM and strength compared to last reassessment. Patient with continued hyperactive and tender lumbar paraspinals. He tolerates dry needling well with several twitch responses and decreased tissue tension following needling. Patient will continue to benefit from physical therapy in order to improve function and reduce impairment. ? ?  ? Personal Factors and Comorbidities Time since onset of  injury/illness/exacerbation   ? Examination-Activity Limitations Bend;Carry;Lift;Stand;Stairs;Squat;Sit   ? Examination-Participation Restrictions Cleaning;Other;Occupation   ? Stability/Clinical Decision Making

## 2022-01-26 ENCOUNTER — Ambulatory Visit (HOSPITAL_COMMUNITY)
Admission: RE | Admit: 2022-01-26 | Discharge: 2022-01-26 | Disposition: A | Discharge status: Home / Self Care | Payer: BC Managed Care – PPO | Source: Ambulatory Visit | Attending: Nurse Practitioner | Admitting: Nurse Practitioner

## 2022-01-26 DIAGNOSIS — M533 Sacrococcygeal disorders, not elsewhere classified: Secondary | ICD-10-CM | POA: Diagnosis present

## 2022-01-26 DIAGNOSIS — M47816 Spondylosis without myelopathy or radiculopathy, lumbar region: Secondary | ICD-10-CM

## 2022-01-26 DIAGNOSIS — R29898 Other symptoms and signs involving the musculoskeletal system: Secondary | ICD-10-CM | POA: Diagnosis present

## 2022-01-31 ENCOUNTER — Encounter (HOSPITAL_COMMUNITY): Payer: Self-pay | Admitting: Physical Therapy

## 2022-01-31 ENCOUNTER — Encounter (HOSPITAL_COMMUNITY): Payer: BC Managed Care – PPO

## 2022-01-31 ENCOUNTER — Ambulatory Visit (HOSPITAL_COMMUNITY): Payer: BC Managed Care – PPO | Admitting: Physical Therapy

## 2022-01-31 DIAGNOSIS — M5416 Radiculopathy, lumbar region: Secondary | ICD-10-CM

## 2022-01-31 DIAGNOSIS — M6281 Muscle weakness (generalized): Secondary | ICD-10-CM

## 2022-01-31 DIAGNOSIS — M5442 Lumbago with sciatica, left side: Secondary | ICD-10-CM | POA: Diagnosis not present

## 2022-01-31 DIAGNOSIS — G8929 Other chronic pain: Secondary | ICD-10-CM

## 2022-01-31 NOTE — Therapy (Signed)
OUTPATIENT PHYSICAL THERAPY TREATMENT NOTE   Patient Name: [name] MRN: [id number] DOB:1966/01/31, 56 y.o., male Today's Date: 01/31/2022  PCP: Assunta Found, MD REFERRING PROVIDER: Georgette Shell     PT End of Session - 01/31/22 1004     Visit Number 19    Number of Visits 30    Date for PT Re-Evaluation 02/21/22    Authorization Type BCBS    Authorization - Visit Number 19    Authorization - Number of Visits 30    Progress Note Due on Visit 28    PT Start Time 1004    PT Stop Time 1046    PT Time Calculation (min) 42 min    Activity Tolerance Patient tolerated treatment well    Behavior During Therapy WFL for tasks assessed/performed             Past Medical History:  Diagnosis Date   Body mass index (BMI) of 19 or less in adult 2007 117 LBS   GERD (gastroesophageal reflux disease)    IBS (irritable bowel syndrome)    IBS (irritable bowel syndrome)    Past Surgical History:  Procedure Laterality Date   ESOPHAGOGASTRODUODENOSCOPY  SEP 2007 EPIG PAIN, DIARRHEA   MILD GASTRITIS/DUODENITIS, NO CELIAC SPRUE, HH,  SCHATZKI'S RING   Patient Active Problem List   Diagnosis Date Noted   SMOKER 07/05/2009   GERD 07/05/2009   IRRITABLE BOWEL SYNDROME 07/05/2009    REFERRING DIAG: Back pain  THERAPY DIAG:  Chronic bilateral low back pain with bilateral sciatica  Muscle weakness (generalized)  Radiculopathy, lumbar region  PERTINENT HISTORY: n/a  PRECAUTIONS: None  SUBJECTIVE: He slipped on stairs the other day and landed on his R butt cheek. He has not heard from results from MRI. Has been trying to do pelvic tilts some but they are sore since fall. Dry needling helps a little but no real changes.   PAIN:  Are you having pain? Yes: NPRS scale: 6/10 Pain location: low back right to center Pain description: aching, throbbing, tight Aggravating factors: standing/walking Relieving factors: steroid shot, heat  Objective: Lumbar AROM:    11/24/2021    12/22/21 01/24/22   Flexion  25% limited * worst  25% limited * worst 0% limited *   Extension  50% limited -stiff  25% limited *  25% limited *   R ROT  25% limited *  25% limited * 25% limited *   L ROT  25% limited *L>R  25% limited * 25% limited *   R SB  13 cm *  17 cm * 16 cm  L SB 13 cm   *  16 cm 16 cm                          * Pain                (Blank rows = not tested)    LE MMT:   MMT Right 11/24/2021 Left 11/24/2021 R 12/22/21 L 12/22/21 R 01/24/22 L 01/24/22  Hip flexion 4+/5 * 4/5* 4+/5 * 4/5* 5/5* 4+/5 *  Hip extension 4/5 * 4/5 * 4+/5 * 4+/5 * 5/5* 5/5*  Hip abduction 4/5 * 4/5 * 4+/5 * 4+/5 * 5/5* 4+/5 *  Hip adduction          Hip internal rotation          Hip external rotation  Knee flexion 4+/5 4+/5 4+/5 * 4+/5 * 5/5* 5/5*  Knee extension 4+/5 4+/5 5/5 * 5/5 * 5/5* 5/5*  Ankle dorsiflexion 5/5/  5/5      Ankle plantarflexion          Ankle inversion          Ankle eversion           (Blank rows = not tested) *= pain    Palpation: TTP lumbar paraspinals, QL mostly R sided 5/16 TTP lumbar paraspinals, QL mostly R sided  TODAY'S TREATMENT:  01/31/22 Discussion of POC, imagining in relation to symptoms experienced Side glides 2x 10 to R  Trigger Point Dry-Needling  Treatment instructions: Expect mild to moderate muscle soreness. S/S of pneumothorax if dry needled over a lung field, and to seek immediate medical attention should they occur. Patient verbalized understanding of these instructions and education.  Patient Consent Given: Yes Education handout provided: Previously provided Muscles treated: R QL Electrical stimulation performed: No Parameters: N/A Treatment response/outcome: twitch response elicited, decrease in tissue tension with palpation, decrease in symptoms following  Palpation and STM concurrent with DN to R QL and lumbar parspinals Side glides 2x 10 to R   01/24/22 DRY NEEDLING: Dry needling consent given? yes Educational  handouts provided? yes Muscles treated: RT lumbar paraspinals, RT lumbar multifidus  Response from dry needling: Good tolerance, several twitches elicited  Manual STM to RT lumbar paraspinals pre and post dry needling for trigger point identification and surface area preparation   Pelvic tilts in supine 2x 20 anterior and posterior   01/11/22 DRY NEEDLING: Dry needling consent given? yes Educational handouts provided? yes Muscles treated: RT lumbar paraspinals, RT lumbar multifidus  Response from dry needling: Good tolerance, several twitches elicited  Manual STM to RT lumbar paraspinals pre and post dry needling for trigger point identification and surface area preparation   Pec stretch in doorway 10" x 5  GTB palloff press 2 x 10 each  GTB rows 2 x 10 GTB extension 2 x 10  01/02/22 DRY NEEDLING: Dry needling consent given? yes Educational handouts provided? yes Muscles treated: bilateral lumbar paraspinals, RT lumbar multifidus  Response from dry needling: Good tolerance, several twitches elicited  Manual STM to bilateral lumbar paraspinals pre and post dry needling for trigger point identification and surface area preparation   Trunk flexion stretch forward and diagonals green ball 5 x 10 second holds each direction   12/27/21 DRY NEEDLING: Dry needling consent given? yes Educational handouts provided? yes Muscles treated: bilateral lumbar paraspinals, RT lumbar multifidus  Response from dry needling: Good tolerance, several twitches elicited  Manual STM to bilateral lumbar paraspinals pre and post dry needling for trigger point identification and surface area preparation   Pec stretch in doorway 10 x 5"    PATIENT EDUCATION: Education details: Dry needling application, exercise form and function, reassessment findings Person educated: Patient Education method: Explanation and Demonstration Education comprehension: verbalized understanding and returned  demonstration   HOME EXERCISE PROGRAM: knee to chest,  prone on elbow with 2 pillows under abdomen, ab set, bridge 2/15 LTR 2/24 seated HS stretch, doorway stretch; all used after heat  11/24/21 hip hike for R QL 01/11/22 palloff press and issued GTB 5/16 posterior pelvic tilts 5/23 lateral Correction at Wall (Mirrored)  - 5 x daily - 7 x weekly - 2 sets - 10 reps   PT Short Term Goals       PT SHORT TERM GOAL #1  Title PT to be I in HEP to have functional back motion    Time 3    Period Weeks    Status Achieved   Target Date 11/11/21      PT SHORT TERM GOAL #2   Title PT to be able to demonstrate proper body mechanics for lifting, pusing pulling  bed mobility to decrease stress off low back and allow pain level to be no greater than 6/10    Time 3    Period Weeks    Status Achieved     PT SHORT TERM GOAL #3   Title Pt LE and core strength to be increase by 1/2 grade to allow pt to tolerate standing x 10 minutes without increased pain.    Period Weeks    Status Achieved   Target Date 11/11/21              PT Long Term Goals       PT LONG TERM GOAL #1   Title Pt to be I in advanced HEP to improve posture so that pt does not exhibit a posterior lean when standing    Time 6    Period Weeks    Status On-going    Target Date 12/02/21      PT LONG TERM GOAL #2   Title Pt to state that he is able to complete a full day work without low back pain going above a 2/10    Time 6    Period Weeks    Status On-going      PT LONG TERM GOAL #3   Title Pt LE to increase one grade to be able to come sit to stand without increased back pain.    Time 6    Period Weeks    Status Achieved     PT LONG TERM GOAL #4   Title Pt core strength and knowledge of body mechanics to increase to allow pt to be able to work on his cars for an hour without increased back pain.    Time 6    Period Weeks    Status On-going              Plan     Clinical Impression Statement Discussed POC  with patient and lumbar pathology. Patient with continued R lateral LBP which appears to be muscular related as he continues to be very TTP to R QL. Performed side glides for lumbar mobility and QL length. Patient tolerates dry needling well in sidelying to access R QL. Several twitch responses elicited. Followed up DN with another round of side glides. States decreased symptoms at EOS but continues to rate at 6/ 10.  Patient will continue to benefit from physical therapy in order to improve function and reduce impairment.     Personal Factors and Comorbidities Time since onset of injury/illness/exacerbation    Examination-Activity Limitations Bend;Carry;Lift;Stand;Stairs;Squat;Sit    Examination-Participation Restrictions Cleaning;Other;Occupation    Stability/Clinical Decision Making Stable/Uncomplicated    Rehab Potential Good    PT Frequency 1x / week    PT Duration 4 weeks    PT Treatment/Interventions Therapeutic exercise;Patient/family education;Functional mobility training;Manual techniques    PT Next Visit Plan F/u on DN response, continue as indicated. Continue to Work on posture,  as well as core strengthening.                  10:53 AM, 01/31/22 Wyman Songster PT, DPT Physical Therapist at [facility]

## 2022-02-08 ENCOUNTER — Ambulatory Visit (HOSPITAL_COMMUNITY): Payer: BC Managed Care – PPO | Admitting: Physical Therapy

## 2022-02-08 ENCOUNTER — Encounter (HOSPITAL_COMMUNITY): Payer: Self-pay | Admitting: Physical Therapy

## 2022-02-08 DIAGNOSIS — M6281 Muscle weakness (generalized): Secondary | ICD-10-CM

## 2022-02-08 DIAGNOSIS — M5442 Lumbago with sciatica, left side: Secondary | ICD-10-CM | POA: Diagnosis not present

## 2022-02-08 DIAGNOSIS — M5416 Radiculopathy, lumbar region: Secondary | ICD-10-CM

## 2022-02-08 DIAGNOSIS — G8929 Other chronic pain: Secondary | ICD-10-CM

## 2022-02-08 NOTE — Therapy (Signed)
OUTPATIENT PHYSICAL THERAPY TREATMENT NOTE   Patient Name: [name] MRN: [id number] DOB:10/22/1965, 56 y.o., male Today's Date: 02/08/2022  PCP: Assunta FoundGolding, John, MD REFERRING PROVIDER: Georgette ShellSara Spencer     PT End of Session - 02/08/22 1035     Visit Number 20    Number of Visits 30    Date for PT Re-Evaluation 02/21/22    Authorization Type BCBS    Authorization - Visit Number 20    Authorization - Number of Visits 30    Progress Note Due on Visit 28    PT Start Time 1033    PT Stop Time 1115    PT Time Calculation (min) 42 min    Activity Tolerance Patient tolerated treatment well    Behavior During Therapy WFL for tasks assessed/performed             Past Medical History:  Diagnosis Date   Body mass index (BMI) of 19 or less in adult 2007 117 LBS   GERD (gastroesophageal reflux disease)    IBS (irritable bowel syndrome)    IBS (irritable bowel syndrome)    Past Surgical History:  Procedure Laterality Date   ESOPHAGOGASTRODUODENOSCOPY  SEP 2007 EPIG PAIN, DIARRHEA   MILD GASTRITIS/DUODENITIS, NO CELIAC SPRUE, HH,  SCHATZKI'S RING   Patient Active Problem List   Diagnosis Date Noted   SMOKER 07/05/2009   GERD 07/05/2009   IRRITABLE BOWEL SYNDROME 07/05/2009    REFERRING DIAG: Back pain  THERAPY DIAG:  Chronic bilateral low back pain with bilateral sciatica  Muscle weakness (generalized)  Radiculopathy, lumbar region  PERTINENT HISTORY: n/a  PRECAUTIONS: None  SUBJECTIVE: Patient reports ongoing back pain. He is frustrated at this point. He does feel dry needling is help[ing. He had MRI 2 weeks ago but he has not heard follow up from his Doctor yet. He is still "waiting for answers"  PAIN:  Are you having pain? Yes: NPRS scale: 4/10 Pain location: low back right to center Pain description: aching, throbbing, tight Aggravating factors: standing/walking Relieving factors: steroid shot, heat  Objective: Lumbar AROM:    11/24/2021   12/22/21  01/24/22   Flexion  25% limited * worst  25% limited * worst 0% limited *   Extension  50% limited -stiff  25% limited *  25% limited *   R ROT  25% limited *  25% limited * 25% limited *   L ROT  25% limited *L>R  25% limited * 25% limited *   R SB  13 cm *  17 cm * 16 cm  L SB 13 cm   *  16 cm 16 cm                          * Pain                (Blank rows = not tested)    LE MMT:   MMT Right 11/24/2021 Left 11/24/2021 R 12/22/21 L 12/22/21 R 01/24/22 L 01/24/22  Hip flexion 4+/5 * 4/5* 4+/5 * 4/5* 5/5* 4+/5 *  Hip extension 4/5 * 4/5 * 4+/5 * 4+/5 * 5/5* 5/5*  Hip abduction 4/5 * 4/5 * 4+/5 * 4+/5 * 5/5* 4+/5 *  Hip adduction          Hip internal rotation          Hip external rotation          Knee flexion 4+/5 4+/5  4+/5 * 4+/5 * 5/5* 5/5*  Knee extension 4+/5 4+/5 5/5 * 5/5 * 5/5* 5/5*  Ankle dorsiflexion 5/5/  5/5      Ankle plantarflexion          Ankle inversion          Ankle eversion           (Blank rows = not tested) *= pain    Palpation: TTP lumbar paraspinals, QL mostly R sided 5/16 TTP lumbar paraspinals, QL mostly R sided  TODAY'S TREATMENT:  02/08/22 DRY NEEDLING: Dry needling consent given? yes Educational handouts provided? yes Muscles treated: Bilat lumbar paraspinals, Bilat lumbar multifidus  Response from dry needling: Good tolerance, several twitches elicited  Manual STM to RT lumbar paraspinals pre and post dry needling for trigger point identification and surface area preparation   Pelvic tilts in supine x 20 anterior and posterior  01/31/22 Discussion of POC, imagining in relation to symptoms experienced Side glides 2x 10 to R  Trigger Point Dry-Needling  Treatment instructions: Expect mild to moderate muscle soreness. S/S of pneumothorax if dry needled over a lung field, and to seek immediate medical attention should they occur. Patient verbalized understanding of these instructions and education.  Patient Consent Given: Yes Education handout  provided: Previously provided Muscles treated: R QL Electrical stimulation performed: No Parameters: N/A Treatment response/outcome: twitch response elicited, decrease in tissue tension with palpation, decrease in symptoms following  Palpation and STM concurrent with DN to R QL and lumbar parspinals Side glides 2x 10 to R   01/24/22 DRY NEEDLING: Dry needling consent given? yes Educational handouts provided? yes Muscles treated: RT lumbar paraspinals, RT lumbar multifidus  Response from dry needling: Good tolerance, several twitches elicited  Manual STM to RT lumbar paraspinals pre and post dry needling for trigger point identification and surface area preparation   Pelvic tilts in supine 2x 20 anterior and posterior    PATIENT EDUCATION: Education details: Dry needling application, exercise form and function, reassessment findings 02/08/22 POC, spine anatomy, HEP, exercise form and function  Person educated: Patient Education method: Explanation and Demonstration Education comprehension: verbalized understanding and returned demonstration   HOME EXERCISE PROGRAM: knee to chest,  prone on elbow with 2 pillows under abdomen, ab set, bridge 2/15 LTR 2/24 seated HS stretch, doorway stretch; all used after heat  11/24/21 hip hike for R QL 01/11/22 palloff press and issued GTB 5/16 posterior pelvic tilts 5/23 lateral Correction at Wall (Mirrored)  - 5 x daily - 7 x weekly - 2 sets - 10 reps   PT Short Term Goals       PT SHORT TERM GOAL #1   Title PT to be I in HEP to have functional back motion    Time 3    Period Weeks    Status Achieved   Target Date 11/11/21      PT SHORT TERM GOAL #2   Title PT to be able to demonstrate proper body mechanics for lifting, pusing pulling  bed mobility to decrease stress off low back and allow pain level to be no greater than 6/10    Time 3    Period Weeks    Status Achieved     PT SHORT TERM GOAL #3   Title Pt LE and core strength to be  increase by 1/2 grade to allow pt to tolerate standing x 10 minutes without increased pain.    Period Weeks    Status Achieved   Target Date  11/11/21              PT Long Term Goals       PT LONG TERM GOAL #1   Title Pt to be I in advanced HEP to improve posture so that pt does not exhibit a posterior lean when standing    Time 6    Period Weeks    Status On-going    Target Date 12/02/21      PT LONG TERM GOAL #2   Title Pt to state that he is able to complete a full day work without low back pain going above a 2/10    Time 6    Period Weeks    Status On-going      PT LONG TERM GOAL #3   Title Pt LE to increase one grade to be able to come sit to stand without increased back pain.    Time 6    Period Weeks    Status Achieved     PT LONG TERM GOAL #4   Title Pt core strength and knowledge of body mechanics to increase to allow pt to be able to work on his cars for an hour without increased back pain.    Time 6    Period Weeks    Status On-going              Plan     Clinical Impression Statement Patient tolerated session well overall. He is frustrated with his current situation regarding MD follow up with his back pain and what the next step is. He feels he is near plateau, but is benefiting from manual treatments. Encouraged continuation on pelvic tilts to reduce lumbar strain and pain levels. Likely Dc to HEP in next 1-2 visits. Patient will continue to benefit from skilled therapy services to reduce remaining deficits and improve functional ability.      Personal Factors and Comorbidities Time since onset of injury/illness/exacerbation    Examination-Activity Limitations Bend;Carry;Lift;Stand;Stairs;Squat;Sit    Examination-Participation Restrictions Cleaning;Other;Occupation    Stability/Clinical Decision Making Stable/Uncomplicated    Rehab Potential Good    PT Frequency 1x / week    PT Duration 4 weeks    PT Treatment/Interventions Therapeutic  exercise;Patient/family education;Functional mobility training;Manual techniques    PT Next Visit Plan Review comprehensive HEP. Likely DC next 1 -2 visits.                   11:12 AM, 02/08/22 Georges Lynch PT DPT  Physical Therapist with [facility]  [phone]

## 2022-02-16 ENCOUNTER — Encounter (HOSPITAL_COMMUNITY): Payer: BC Managed Care – PPO | Admitting: Physical Therapy

## 2022-02-28 ENCOUNTER — Encounter (HOSPITAL_COMMUNITY): Payer: BC Managed Care – PPO | Admitting: Physical Therapy

## 2022-03-07 ENCOUNTER — Encounter (HOSPITAL_COMMUNITY): Payer: BC Managed Care – PPO | Admitting: Physical Therapy
